# Patient Record
Sex: Female | Born: 1960 | Race: White | Hispanic: No | Marital: Married | Smoking: Never smoker
Health system: Southern US, Community
[De-identification: ages and names within clinical notes are randomized; demographics above are authoritative.]

## PROBLEM LIST (undated history)

## (undated) DIAGNOSIS — F32A Depression, unspecified: Secondary | ICD-10-CM

## (undated) DIAGNOSIS — L409 Psoriasis, unspecified: Secondary | ICD-10-CM

## (undated) DIAGNOSIS — F419 Anxiety disorder, unspecified: Secondary | ICD-10-CM

## (undated) DIAGNOSIS — I10 Essential (primary) hypertension: Secondary | ICD-10-CM

## (undated) DIAGNOSIS — G43909 Migraine, unspecified, not intractable, without status migrainosus: Secondary | ICD-10-CM

## (undated) DIAGNOSIS — F329 Major depressive disorder, single episode, unspecified: Secondary | ICD-10-CM

## (undated) DIAGNOSIS — B019 Varicella without complication: Secondary | ICD-10-CM

## (undated) HISTORY — DX: Major depressive disorder, single episode, unspecified: F32.9

## (undated) HISTORY — DX: Migraine, unspecified, not intractable, without status migrainosus: G43.909

## (undated) HISTORY — DX: Essential (primary) hypertension: I10

## (undated) HISTORY — DX: Anxiety disorder, unspecified: F41.9

## (undated) HISTORY — DX: Psoriasis, unspecified: L40.9

## (undated) HISTORY — DX: Depression, unspecified: F32.A

## (undated) HISTORY — DX: Varicella without complication: B01.9

---

## 2012-04-01 DIAGNOSIS — L404 Guttate psoriasis: Secondary | ICD-10-CM | POA: Insufficient documentation

## 2012-04-01 DIAGNOSIS — I10 Essential (primary) hypertension: Secondary | ICD-10-CM | POA: Insufficient documentation

## 2012-04-01 DIAGNOSIS — E876 Hypokalemia: Secondary | ICD-10-CM | POA: Insufficient documentation

## 2014-02-09 DIAGNOSIS — J309 Allergic rhinitis, unspecified: Secondary | ICD-10-CM | POA: Insufficient documentation

## 2014-02-09 DIAGNOSIS — E782 Mixed hyperlipidemia: Secondary | ICD-10-CM | POA: Insufficient documentation

## 2014-10-02 ENCOUNTER — Ambulatory Visit: Payer: Self-pay | Admitting: Physician Assistant

## 2014-10-02 LAB — RAPID STREP-A WITH REFLX: Micro Text Report: NEGATIVE

## 2014-10-05 LAB — BETA STREP CULTURE(ARMC)

## 2015-08-18 DIAGNOSIS — F319 Bipolar disorder, unspecified: Secondary | ICD-10-CM | POA: Insufficient documentation

## 2017-09-01 ENCOUNTER — Ambulatory Visit (INDEPENDENT_AMBULATORY_CARE_PROVIDER_SITE_OTHER): Payer: BC Managed Care – PPO | Admitting: Family

## 2017-09-01 ENCOUNTER — Other Ambulatory Visit: Payer: Self-pay

## 2017-09-01 ENCOUNTER — Encounter: Payer: Self-pay | Admitting: Family

## 2017-09-01 VITALS — BP 116/74 | HR 91 | Temp 98.6°F | Ht 62.0 in | Wt 150.0 lb

## 2017-09-01 DIAGNOSIS — F3130 Bipolar disorder, current episode depressed, mild or moderate severity, unspecified: Secondary | ICD-10-CM | POA: Diagnosis not present

## 2017-09-01 DIAGNOSIS — I10 Essential (primary) hypertension: Secondary | ICD-10-CM | POA: Diagnosis not present

## 2017-09-01 DIAGNOSIS — Z0000 Encounter for general adult medical examination without abnormal findings: Secondary | ICD-10-CM | POA: Diagnosis not present

## 2017-09-01 DIAGNOSIS — L404 Guttate psoriasis: Secondary | ICD-10-CM | POA: Diagnosis not present

## 2017-09-01 DIAGNOSIS — Z23 Encounter for immunization: Secondary | ICD-10-CM | POA: Diagnosis not present

## 2017-09-01 DIAGNOSIS — F319 Bipolar disorder, unspecified: Secondary | ICD-10-CM

## 2017-09-01 MED ORDER — LOSARTAN POTASSIUM-HCTZ 100-25 MG PO TABS: 1.0000 | ORAL_TABLET | Freq: Every day | ORAL | 1 refills | Status: DC

## 2017-09-01 NOTE — Patient Instructions (Addendum)
Pleasure meeting you!  Fasting labs when you can.   Please establish with dermatology for annual skin check and psoriasis- let me know if you end up needing a referral as discussed.   Health Maintenance, Female Adopting a healthy lifestyle and getting preventive care can go a long way to promote health and wellness. Talk with your health care provider about what schedule of regular examinations is right for you. This is a good chance for you to check in with your provider about disease prevention and staying healthy. In between checkups, there are plenty of things you can do on your own. Experts have done a lot of research about which lifestyle changes and preventive measures are most likely to keep you healthy. Ask your health care provider for more information. Weight and diet Eat a healthy diet  Be sure to include plenty of vegetables, fruits, low-fat dairy products, and lean protein.  Do not eat a lot of foods high in solid fats, added sugars, or salt.  Get regular exercise. This is one of the most important things you can do for your health. ? Most adults should exercise for at least 150 minutes each week. The exercise should increase your heart rate and make you sweat (moderate-intensity exercise). ? Most adults should also do strengthening exercises at least twice a week. This is in addition to the moderate-intensity exercise.  Maintain a healthy weight  Body mass index (BMI) is a measurement that can be used to identify possible weight problems. It estimates body fat based on height and weight. Your health care provider can help determine your BMI and help you achieve or maintain a healthy weight.  For females 29 years of age and older: ? A BMI below 18.5 is considered underweight. ? A BMI of 18.5 to 24.9 is normal. ? A BMI of 25 to 29.9 is considered overweight. ? A BMI of 30 and above is considered obese.  Watch levels of cholesterol and blood lipids  You should start having  your blood tested for lipids and cholesterol at 55 years of age, then have this test every 5 years.  You may need to have your cholesterol levels checked more often if: ? Your lipid or cholesterol levels are high. ? You are older than 56 years of age. ? You are at high risk for heart disease.  Cancer screening Lung Cancer  Lung cancer screening is recommended for adults 105-102 years old who are at high risk for lung cancer because of a history of smoking.  A yearly low-dose CT scan of the lungs is recommended for people who: ? Currently smoke. ? Have quit within the past 15 years. ? Have at least a 30-pack-year history of smoking. A pack year is smoking an average of one pack of cigarettes a day for 1 year.  Yearly screening should continue until it has been 15 years since you quit.  Yearly screening should stop if you develop a health problem that would prevent you from having lung cancer treatment.  Breast Cancer  Practice breast self-awareness. This means understanding how your breasts normally appear and feel.  It also means doing regular breast self-exams. Let your health care provider know about any changes, no matter how small.  If you are in your 20s or 30s, you should have a clinical breast exam (CBE) by a health care provider every 1-3 years as part of a regular health exam.  If you are 86 or older, have a CBE every year.  Also consider having a breast X-ray (mammogram) every year.  If you have a family history of breast cancer, talk to your health care provider about genetic screening.  If you are at high risk for breast cancer, talk to your health care provider about having an MRI and a mammogram every year.  Breast cancer gene (BRCA) assessment is recommended for women who have family members with BRCA-related cancers. BRCA-related cancers include: ? Breast. ? Ovarian. ? Tubal. ? Peritoneal cancers.  Results of the assessment will determine the need for genetic  counseling and BRCA1 and BRCA2 testing.  Cervical Cancer Your health care provider may recommend that you be screened regularly for cancer of the pelvic organs (ovaries, uterus, and vagina). This screening involves a pelvic examination, including checking for microscopic changes to the surface of your cervix (Pap test). You may be encouraged to have this screening done every 3 years, beginning at age 53.  For women ages 31-65, health care providers may recommend pelvic exams and Pap testing every 3 years, or they may recommend the Pap and pelvic exam, combined with testing for human papilloma virus (HPV), every 5 years. Some types of HPV increase your risk of cervical cancer. Testing for HPV may also be done on women of any age with unclear Pap test results.  Other health care providers may not recommend any screening for nonpregnant women who are considered low risk for pelvic cancer and who do not have symptoms. Ask your health care provider if a screening pelvic exam is right for you.  If you have had past treatment for cervical cancer or a condition that could lead to cancer, you need Pap tests and screening for cancer for at least 20 years after your treatment. If Pap tests have been discontinued, your risk factors (such as having a new sexual partner) need to be reassessed to determine if screening should resume. Some women have medical problems that increase the chance of getting cervical cancer. In these cases, your health care provider may recommend more frequent screening and Pap tests.  Colorectal Cancer  This type of cancer can be detected and often prevented.  Routine colorectal cancer screening usually begins at 56 years of age and continues through 56 years of age.  Your health care provider may recommend screening at an earlier age if you have risk factors for colon cancer.  Your health care provider may also recommend using home test kits to check for hidden blood in the  stool.  A small camera at the end of a tube can be used to examine your colon directly (sigmoidoscopy or colonoscopy). This is done to check for the earliest forms of colorectal cancer.  Routine screening usually begins at age 66.  Direct examination of the colon should be repeated every 5-10 years through 56 years of age. However, you may need to be screened more often if early forms of precancerous polyps or small growths are found.  Skin Cancer  Check your skin from head to toe regularly.  Tell your health care provider about any new moles or changes in moles, especially if there is a change in a mole's shape or color.  Also tell your health care provider if you have a mole that is larger than the size of a pencil eraser.  Always use sunscreen. Apply sunscreen liberally and repeatedly throughout the day.  Protect yourself by wearing long sleeves, pants, a wide-brimmed hat, and sunglasses whenever you are outside.  Heart disease, diabetes, and high  blood pressure  High blood pressure causes heart disease and increases the risk of stroke. High blood pressure is more likely to develop in: ? People who have blood pressure in the high end of the normal range (130-139/85-89 mm Hg). ? People who are overweight or obese. ? People who are African American.  If you are 3-100 years of age, have your blood pressure checked every 3-5 years. If you are 49 years of age or older, have your blood pressure checked every year. You should have your blood pressure measured twice-once when you are at a hospital or clinic, and once when you are not at a hospital or clinic. Record the average of the two measurements. To check your blood pressure when you are not at a hospital or clinic, you can use: ? An automated blood pressure machine at a pharmacy. ? A home blood pressure monitor.  If you are between 62 years and 2 years old, ask your health care provider if you should take aspirin to prevent  strokes.  Have regular diabetes screenings. This involves taking a blood sample to check your fasting blood sugar level. ? If you are at a normal weight and have a low risk for diabetes, have this test once every three years after 56 years of age. ? If you are overweight and have a high risk for diabetes, consider being tested at a younger age or more often. Preventing infection Hepatitis B  If you have a higher risk for hepatitis B, you should be screened for this virus. You are considered at high risk for hepatitis B if: ? You were born in a country where hepatitis B is common. Ask your health care provider which countries are considered high risk. ? Your parents were born in a high-risk country, and you have not been immunized against hepatitis B (hepatitis B vaccine). ? You have HIV or AIDS. ? You use needles to inject street drugs. ? You live with someone who has hepatitis B. ? You have had sex with someone who has hepatitis B. ? You get hemodialysis treatment. ? You take certain medicines for conditions, including cancer, organ transplantation, and autoimmune conditions.  Hepatitis C  Blood testing is recommended for: ? Everyone born from [birth year] through 1965. ? Anyone with known risk factors for hepatitis C.  Sexually transmitted infections (STIs)  You should be screened for sexually transmitted infections (STIs) including gonorrhea and chlamydia if: ? You are sexually active and are younger than 56 years of age. ? You are older than 56 years of age and your health care provider tells you that you are at risk for this type of infection. ? Your sexual activity has changed since you were last screened and you are at an increased risk for chlamydia or gonorrhea. Ask your health care provider if you are at risk.  If you do not have HIV, but are at risk, it may be recommended that you take a prescription medicine daily to prevent HIV infection. This is called pre-exposure prophylaxis  (PrEP). You are considered at risk if: ? You are sexually active and do not regularly use condoms or know the HIV status of your partner(s). ? You take drugs by injection. ? You are sexually active with a partner who has HIV.  Talk with your health care provider about whether you are at high risk of being infected with HIV. If you choose to begin PrEP, you should first be tested for HIV. You should then be tested  every 3 months for as long as you are taking PrEP. Pregnancy  If you are premenopausal and you may become pregnant, ask your health care provider about preconception counseling.  If you may become pregnant, take 400 to 800 micrograms (mcg) of folic acid every day.  If you want to prevent pregnancy, talk to your health care provider about birth control (contraception). Osteoporosis and menopause  Osteoporosis is a disease in which the bones lose minerals and strength with aging. This can result in serious bone fractures. Your risk for osteoporosis can be identified using a bone density scan.  If you are 66 years of age or older, or if you are at risk for osteoporosis and fractures, ask your health care provider if you should be screened.  Ask your health care provider whether you should take a calcium or vitamin D supplement to lower your risk for osteoporosis.  Menopause may have certain physical symptoms and risks.  Hormone replacement therapy may reduce some of these symptoms and risks. Talk to your health care provider about whether hormone replacement therapy is right for you. Follow these instructions at home:  Schedule regular health, dental, and eye exams.  Stay current with your immunizations.  Do not use any tobacco products including cigarettes, chewing tobacco, or electronic cigarettes.  If you are pregnant, do not drink alcohol.  If you are breastfeeding, limit how much and how often you drink alcohol.  Limit alcohol intake to no more than 1 drink per day for  nonpregnant women. One drink equals 12 ounces of beer, 5 ounces of wine, or 1 ounces of hard liquor.  Do not use street drugs.  Do not share needles.  Ask your health care provider for help if you need support or information about quitting drugs.  Tell your health care provider if you often feel depressed.  Tell your health care provider if you have ever been abused or do not feel safe at home. This information is not intended to replace advice given to you by your health care provider. Make sure you discuss any questions you have with your health care provider. Document Released: 06/03/2011 Document Revised: 04/25/2016 Document Reviewed: 08/22/2015 Elsevier Interactive Patient Education  [employer].

## 2017-09-01 NOTE — Progress Notes (Signed)
Subjective:    Patient ID: Brooke Harmon, female    DOB: [birth year], 56 y.o.   MRN: [id number]  CC: Brooke Harmon is a 56 y.o. female who presents today for physical exam.    HPI: [facility] since [facility].  Feeling well.   Bipolar depression- Follows with Magda Paganini DNP. Stable on regimen.   HTN- has been on stable years. Denies exertional chest pain or pressure, numbness or tingling radiating to left arm or jaw, palpitations, dizziness, frequent headaches, changes in vision, or shortness of breath.       Colorectal Cancer Screening: UTD , due 2021 Breast Cancer Screening: Mammogram UTD 01/2017; follows [facility] Cervical Cancer Screening: UTD, 01/2017 per patient. No polyps.  Bone Health screening DEXA for 65+: No increased fracture risk. Defer screening at this time. Lung Cancer Screening: Doesn't have 30 year pack year history and age > 22 years. Immunizations       Tetanus - UTD              Influenza- states she hasnt received flu shot 08/02/17 and that was a chart error. She would like flu vaccine today.  Hepatitis C screening - Candidate for, consents HIV Screening- Candidate for , declines Labs: Screening labs today. Exercise: Gets regular exercise.  Alcohol use: Couple times per week.  Smoking/tobacco use: Nonsmoker.  Regular dental exams: UTD Wears seat belt: Yes. Skin: h/o psorisis; no new lesions however reports worsening of late of psoriasis on face.   HISTORY:  Past Medical History:  Diagnosis Date  . Chicken pox   . Depression   . Hypertension   . Migraines   . Psoriasis     Past Surgical History:  Procedure Laterality Date  . CESAREAN SECTION     Family History  Problem Relation Age of Onset  . Hypertension Mother   . Mental illness Mother   . Cancer Father        prostate  . Hypertension Father   . Mental illness Father   . Diabetes Father       ALLERGIES: Lexapro  [escitalopram oxalate] and  Lisinopril  No current outpatient prescriptions on file prior to visit.   No current facility-administered medications on file prior to visit.     Social History  Substance Use Topics  . Smoking status: Never Smoker  . Smokeless tobacco: Never Used  . Alcohol use Yes    Review of Systems  Constitutional: Negative for chills, fever and unexpected weight change.  HENT: Negative for congestion.   Eyes: Negative for visual disturbance.  Respiratory: Negative for cough.   Cardiovascular: Negative for chest pain, palpitations and leg swelling.  Gastrointestinal: Negative for nausea and vomiting.  Musculoskeletal: Negative for arthralgias and myalgias.  Skin: Negative for rash.  Neurological: Negative for headaches.  Hematological: Negative for adenopathy.  Psychiatric/Behavioral: Negative for confusion.      Objective:    BP 116/74   Pulse 91   Temp 98.6 F (37 C) (Oral)   Ht 5\' 2"  (1.575 m)   Wt 150 lb (68 kg)   SpO2 96%   BMI 27.44 kg/m   BP Readings from Last 3 Encounters:  09/01/17 116/74   Wt Readings from Last 3 Encounters:  09/01/17 150 lb (68 kg)    Physical Exam  Constitutional: She appears well-developed and well-nourished.  Eyes: Conjunctivae are normal.  Neck: No thyroid mass and no thyromegaly present.  Cardiovascular: Normal rate, regular rhythm, normal heart  sounds and normal pulses.   Pulmonary/Chest: Effort normal and breath sounds normal. She has no wheezes. She has no rhonchi. She has no rales. Right breast exhibits no inverted nipple, no mass, no nipple discharge, no skin change and no tenderness. Left breast exhibits no inverted nipple, no mass, no nipple discharge, no skin change and no tenderness. Breasts are symmetrical.  CBE performed.   Lymphadenopathy:       Head (right side): No submental, no submandibular, no tonsillar, no preauricular, no posterior auricular and no occipital adenopathy present.       Head (left side): No submental, no  submandibular, no tonsillar, no preauricular, no posterior auricular and no occipital adenopathy present.    She has no cervical adenopathy.       Right cervical: No superficial cervical, no deep cervical and no posterior cervical adenopathy present.      Left cervical: No superficial cervical, no deep cervical and no posterior cervical adenopathy present.    She has no axillary adenopathy.  Neurological: She is alert.  Skin: Skin is warm and dry.  Psychiatric: She has a normal mood and affect. Her speech is normal and behavior is normal. Thought content normal.  Vitals reviewed.      Assessment & Plan:   Problem List Items Addressed This Visit      Cardiovascular and Mediastinum   Essential hypertension    Stable. Discussed need to do electrolytes, kidney function few times per year due to hyzaar. Patient understands this. Will continue regimen.       Relevant Medications   losartan-hydrochlorothiazide (HYZAAR) 100-25 MG tablet     Musculoskeletal and Integument   Psoriasis, guttate    Worsening. Patient will establish with dermatology once she checks her insurance; declines referral today and will let me know if she needs.         Other   Bipolar depression ([place])    Stable on regimen.       Routine physical examination - Primary    Up-to-date on colonoscopy, Pap smear, mammogram per patient and abstracted into chart. Up-to-date on immunizations.Screening labs ordered. Clinical breast exam performed today.no pelvic exam performed in the absence of complaints and Pap smear being normal per patient. Encouraged exercise. Patient will establish with dermatology.       Relevant Orders   CBC with Differential/Platelet   Comprehensive metabolic panel   Hemoglobin A1c   Hepatitis C antibody   Lipid panel   TSH   VITAMIN D 25 Hydroxy (Vit-D Deficiency, Fractures)    Other Visit Diagnoses    Need for immunization against influenza       Relevant Orders   Flu Vaccine QUAD 36+  mos IM (Completed)       I have discontinued Ms. Frieling's pimecrolimus and Sulfacetamide Sodium-Sulfur. I am also having her maintain her lamoTRIgine, LATUDA, tacrolimus, and losartan-hydrochlorothiazide.   Meds ordered this encounter  Medications  . losartan-hydrochlorothiazide (HYZAAR) 100-25 MG tablet    Sig: Take 1 tablet by mouth daily.    Dispense:  90 tablet    Refill:  1    Order Specific Question:   Supervising Provider    Answer:   Crecencio Mc [[id number]]    Return precautions given.   Risks, benefits, and alternatives of the medications and treatment plan prescribed today were discussed, and patient expressed understanding.   Education regarding symptom management and diagnosis given to patient on AVS.   Continue to follow with Vidal Schwalbe Yvetta Coder, FNP for  routine health maintenance.   Brooke Harmon and I agreed with plan.   Mable Paris, FNP

## 2017-09-01 NOTE — Assessment & Plan Note (Signed)
Worsening. Patient will establish with dermatology once she checks her insurance; declines referral today and will let me know if she needs.

## 2017-09-01 NOTE — Assessment & Plan Note (Addendum)
Up-to-date on colonoscopy, Pap smear, mammogram per patient and abstracted into chart. Up-to-date on immunizations.Screening labs ordered. Clinical breast exam performed today.no pelvic exam performed in the absence of complaints and Pap smear being normal per patient. Encouraged exercise. Patient will establish with dermatology.

## 2017-09-01 NOTE — Progress Notes (Signed)
Pre visit review using our clinic review tool, if applicable. No additional management support is needed unless otherwise documented below in the visit note. 

## 2017-09-01 NOTE — Progress Notes (Unsigned)
Pre visit review using our clinic review tool, if applicable. No additional management support is needed unless otherwise documented below in the visit note. 

## 2017-09-01 NOTE — Assessment & Plan Note (Signed)
Stable. Discussed need to do electrolytes, kidney function few times per year due to hyzaar. Patient understands this. Will continue regimen.

## 2017-09-01 NOTE — Assessment & Plan Note (Signed)
Stable on regimen 

## 2017-09-03 ENCOUNTER — Other Ambulatory Visit (INDEPENDENT_AMBULATORY_CARE_PROVIDER_SITE_OTHER): Payer: BC Managed Care – PPO

## 2017-09-03 DIAGNOSIS — Z0000 Encounter for general adult medical examination without abnormal findings: Secondary | ICD-10-CM | POA: Diagnosis not present

## 2017-09-03 LAB — CBC WITH DIFFERENTIAL/PLATELET
BASOS ABS: 0.2 10*3/uL — AB (ref 0.0–0.1)
Basophils Relative: 2.4 % (ref 0.0–3.0)
Eosinophils Absolute: 0.1 10*3/uL (ref 0.0–0.7)
Eosinophils Relative: 1.7 % (ref 0.0–5.0)
HCT: 41.7 % (ref 36.0–46.0)
Hemoglobin: 13.8 g/dL (ref 12.0–15.0)
LYMPHS ABS: 1.8 10*3/uL (ref 0.7–4.0)
Lymphocytes Relative: 28.9 % (ref 12.0–46.0)
MCHC: 33.1 g/dL (ref 30.0–36.0)
MCV: 91.2 fl (ref 78.0–100.0)
MONOS PCT: 11.8 % (ref 3.0–12.0)
Monocytes Absolute: 0.7 10*3/uL (ref 0.1–1.0)
NEUTROS ABS: 3.4 10*3/uL (ref 1.4–7.7)
NEUTROS PCT: 55.2 % (ref 43.0–77.0)
PLATELETS: 344 10*3/uL (ref 150.0–400.0)
RBC: 4.57 Mil/uL (ref 3.87–5.11)
RDW: 13.5 % (ref 11.5–15.5)
WBC: 6.2 10*3/uL (ref 4.0–10.5)

## 2017-09-03 LAB — COMPREHENSIVE METABOLIC PANEL
ALT: 16 U/L (ref 0–35)
AST: 13 U/L (ref 0–37)
Albumin: 4.1 g/dL (ref 3.5–5.2)
Alkaline Phosphatase: 79 U/L (ref 39–117)
BUN: 18 mg/dL (ref 6–23)
CO2: 29 meq/L (ref 19–32)
Calcium: 9.3 mg/dL (ref 8.4–10.5)
Chloride: 101 mEq/L (ref 96–112)
Creatinine, Ser: 0.91 mg/dL (ref 0.40–1.20)
GFR: 67.86 mL/min (ref >60.00)
GLUCOSE: 99 mg/dL (ref 70–99)
POTASSIUM: 3.7 meq/L (ref 3.5–5.1)
Sodium: 137 mEq/L (ref 135–145)
Total Bilirubin: 0.6 mg/dL (ref 0.2–1.2)
Total Protein: 7.3 g/dL (ref 6.0–8.3)

## 2017-09-03 LAB — LIPID PANEL
CHOL/HDL RATIO: 4
Cholesterol: 186 mg/dL (ref 0–200)
HDL: 47 mg/dL (ref >39.00)
LDL Cholesterol: 118 mg/dL — ABNORMAL HIGH (ref 0–99)
NonHDL: 139.01
TRIGLYCERIDES: 107 mg/dL (ref 0.0–149.0)
VLDL: 21.4 mg/dL (ref 0.0–40.0)

## 2017-09-03 LAB — TSH: TSH: 3.12 u[IU]/mL (ref 0.35–4.50)

## 2017-09-03 LAB — HEMOGLOBIN A1C: Hgb A1c MFr Bld: 5.6 % (ref 4.6–6.5)

## 2017-09-03 LAB — VITAMIN D 25 HYDROXY (VIT D DEFICIENCY, FRACTURES): VITD: 16.3 ng/mL — ABNORMAL LOW (ref 30.00–100.00)

## 2017-12-08 ENCOUNTER — Telehealth: Payer: Self-pay

## 2017-12-08 DIAGNOSIS — E559 Vitamin D deficiency, unspecified: Secondary | ICD-10-CM

## 2017-12-08 NOTE — Telephone Encounter (Signed)
Labs have been ordered but could not reach patient to schedule.

## 2017-12-08 NOTE — Telephone Encounter (Signed)
Copied from [place]. Topic: Inquiry >> Dec 08, 2017  1:01 PM Pricilla Handler wrote: Reason for CRM: Patient called wanting to schedule a lab to test her Vitamin D levels. Patient stated that Dr. Vidal Schwalbe wanted patient to have this test. Could not schedule, due to no order in Epic. Please call patient once order has been placed for this lab test.      Thank You!!!

## 2017-12-10 ENCOUNTER — Other Ambulatory Visit (INDEPENDENT_AMBULATORY_CARE_PROVIDER_SITE_OTHER): Payer: BC Managed Care – PPO

## 2017-12-10 DIAGNOSIS — E559 Vitamin D deficiency, unspecified: Secondary | ICD-10-CM

## 2017-12-11 LAB — VITAMIN D 25 HYDROXY (VIT D DEFICIENCY, FRACTURES): VITD: 22.8 ng/mL — ABNORMAL LOW (ref 30.00–100.00)

## 2017-12-14 ENCOUNTER — Other Ambulatory Visit: Payer: Self-pay | Admitting: Internal Medicine

## 2017-12-14 DIAGNOSIS — E559 Vitamin D deficiency, unspecified: Secondary | ICD-10-CM

## 2017-12-14 MED ORDER — CHOLECALCIFEROL 1.25 MG (50000 UT) PO TABS
1.0000 | ORAL_TABLET | ORAL | 1 refills | Status: DC
Start: 2017-12-14 — End: 2018-09-23

## 2018-08-08 ENCOUNTER — Other Ambulatory Visit: Payer: Self-pay | Admitting: Family

## 2018-08-08 DIAGNOSIS — I10 Essential (primary) hypertension: Secondary | ICD-10-CM

## 2018-09-23 ENCOUNTER — Ambulatory Visit (INDEPENDENT_AMBULATORY_CARE_PROVIDER_SITE_OTHER): Payer: BC Managed Care – PPO | Admitting: Family

## 2018-09-23 VITALS — BP 119/70 | HR 85 | Temp 98.4°F | Resp 15 | Ht 62.0 in | Wt 145.2 lb

## 2018-09-23 DIAGNOSIS — E559 Vitamin D deficiency, unspecified: Secondary | ICD-10-CM | POA: Insufficient documentation

## 2018-09-23 DIAGNOSIS — Z0000 Encounter for general adult medical examination without abnormal findings: Secondary | ICD-10-CM

## 2018-09-23 DIAGNOSIS — F319 Bipolar disorder, unspecified: Secondary | ICD-10-CM

## 2018-09-23 DIAGNOSIS — I10 Essential (primary) hypertension: Secondary | ICD-10-CM

## 2018-09-23 LAB — COMPREHENSIVE METABOLIC PANEL
ALBUMIN: 4.4 g/dL (ref 3.5–5.2)
ALK PHOS: 83 U/L (ref 39–117)
ALT: 15 U/L (ref 0–35)
AST: 15 U/L (ref 0–37)
BILIRUBIN TOTAL: 0.7 mg/dL (ref 0.2–1.2)
BUN: 19 mg/dL (ref 6–23)
CALCIUM: 9.8 mg/dL (ref 8.4–10.5)
CO2: 29 mEq/L (ref 19–32)
CREATININE: 0.92 mg/dL (ref 0.40–1.20)
Chloride: 103 mEq/L (ref 96–112)
GFR: 66.76 mL/min (ref >60.00)
Glucose, Bld: 128 mg/dL — ABNORMAL HIGH (ref 70–99)
Potassium: 3.4 mEq/L — ABNORMAL LOW (ref 3.5–5.1)
Sodium: 140 mEq/L (ref 135–145)
TOTAL PROTEIN: 7.6 g/dL (ref 6.0–8.3)

## 2018-09-23 LAB — VITAMIN D 25 HYDROXY (VIT D DEFICIENCY, FRACTURES): VITD: 37.24 ng/mL (ref 30.00–100.00)

## 2018-09-23 MED ORDER — LOSARTAN POTASSIUM-HCTZ 100-25 MG PO TABS: 1.0000 | ORAL_TABLET | Freq: Every day | ORAL | 4 refills | Status: DC

## 2018-09-23 NOTE — Assessment & Plan Note (Signed)
Stable. Will follow.  

## 2018-09-23 NOTE — Assessment & Plan Note (Signed)
Update mammogram.  Clinical breast exam performed today.  Patient follows with OB/GYN and reports normal Pap smear last year.  Deferred pelvic exam in the absence of complaints, normal Pap smear.Note: We discussed relevant and past history of abnormal labs today at length, we decided to order appropriate labs for screening today based on this discussion. Patient was comfortable with this. Patient was advised if any symptoms were to change after today's visit, to notify me as we may always order additional labs.

## 2018-09-23 NOTE — Patient Instructions (Signed)
Pleasure seeing you  Health Maintenance, Female Adopting a healthy lifestyle and getting preventive care can go a long way to promote health and wellness. Talk with your health care provider about what schedule of regular examinations is right for you. This is a good chance for you to check in with your provider about disease prevention and staying healthy. In between checkups, there are plenty of things you can do on your own. Experts have done a lot of research about which lifestyle changes and preventive measures are most likely to keep you healthy. Ask your health care provider for more information. Weight and diet Eat a healthy diet  Be sure to include plenty of vegetables, fruits, low-fat dairy products, and lean protein.  Do not eat a lot of foods high in solid fats, added sugars, or salt.  Get regular exercise. This is one of the most important things you can do for your health. ? Most adults should exercise for at least 150 minutes each week. The exercise should increase your heart rate and make you sweat (moderate-intensity exercise). ? Most adults should also do strengthening exercises at least twice a week. This is in addition to the moderate-intensity exercise.  Maintain a healthy weight  Body mass index (BMI) is a measurement that can be used to identify possible weight problems. It estimates body fat based on height and weight. Your health care provider can help determine your BMI and help you achieve or maintain a healthy weight.  For females 20 years of age and older: ? A BMI below 18.5 is considered underweight. ? A BMI of 18.5 to 24.9 is normal. ? A BMI of 25 to 29.9 is considered overweight. ? A BMI of 30 and above is considered obese.  Watch levels of cholesterol and blood lipids  You should start having your blood tested for lipids and cholesterol at 57 years of age, then have this test every 5 years.  You may need to have your cholesterol levels checked more often  if: ? Your lipid or cholesterol levels are high. ? You are older than 57 years of age. ? You are at high risk for heart disease.  Cancer screening Lung Cancer  Lung cancer screening is recommended for adults 55-80 years old who are at high risk for lung cancer because of a history of smoking.  A yearly low-dose CT scan of the lungs is recommended for people who: ? Currently smoke. ? Have quit within the past 15 years. ? Have at least a 30-pack-year history of smoking. A pack year is smoking an average of one pack of cigarettes a day for 1 year.  Yearly screening should continue until it has been 15 years since you quit.  Yearly screening should stop if you develop a health problem that would prevent you from having lung cancer treatment.  Breast Cancer  Practice breast self-awareness. This means understanding how your breasts normally appear and feel.  It also means doing regular breast self-exams. Let your health care provider know about any changes, no matter how small.  If you are in your 20s or 30s, you should have a clinical breast exam (CBE) by a health care provider every 1-3 years as part of a regular health exam.  If you are 40 or older, have a CBE every year. Also consider having a breast X-ray (mammogram) every year.  If you have a family history of breast cancer, talk to your health care provider about genetic screening.  If you   are at high risk for breast cancer, talk to your health care provider about having an MRI and a mammogram every year.  Breast cancer gene (BRCA) assessment is recommended for women who have family members with BRCA-related cancers. BRCA-related cancers include: ? Breast. ? Ovarian. ? Tubal. ? Peritoneal cancers.  Results of the assessment will determine the need for genetic counseling and BRCA1 and BRCA2 testing.  Cervical Cancer Your health care provider may recommend that you be screened regularly for cancer of the pelvic organs  (ovaries, uterus, and vagina). This screening involves a pelvic examination, including checking for microscopic changes to the surface of your cervix (Pap test). You may be encouraged to have this screening done every 3 years, beginning at age 72.  For women ages 67-65, health care providers may recommend pelvic exams and Pap testing every 3 years, or they may recommend the Pap and pelvic exam, combined with testing for human papilloma virus (HPV), every 5 years. Some types of HPV increase your risk of cervical cancer. Testing for HPV may also be done on women of any age with unclear Pap test results.  Other health care providers may not recommend any screening for nonpregnant women who are considered low risk for pelvic cancer and who do not have symptoms. Ask your health care provider if a screening pelvic exam is right for you.  If you have had past treatment for cervical cancer or a condition that could lead to cancer, you need Pap tests and screening for cancer for at least 20 years after your treatment. If Pap tests have been discontinued, your risk factors (such as having a new sexual partner) need to be reassessed to determine if screening should resume. Some women have medical problems that increase the chance of getting cervical cancer. In these cases, your health care provider may recommend more frequent screening and Pap tests.  Colorectal Cancer  This type of cancer can be detected and often prevented.  Routine colorectal cancer screening usually begins at 57 years of age and continues through 57 years of age.  Your health care provider may recommend screening at an earlier age if you have risk factors for colon cancer.  Your health care provider may also recommend using home test kits to check for hidden blood in the stool.  A small camera at the end of a tube can be used to examine your colon directly (sigmoidoscopy or colonoscopy). This is done to check for the earliest forms of  colorectal cancer.  Routine screening usually begins at age 74.  Direct examination of the colon should be repeated every 5-10 years through 57 years of age. However, you may need to be screened more often if early forms of precancerous polyps or small growths are found.  Skin Cancer  Check your skin from head to toe regularly.  Tell your health care provider about any new moles or changes in moles, especially if there is a change in a mole's shape or color.  Also tell your health care provider if you have a mole that is larger than the size of a pencil eraser.  Always use sunscreen. Apply sunscreen liberally and repeatedly throughout the day.  Protect yourself by wearing long sleeves, pants, a wide-brimmed hat, and sunglasses whenever you are outside.  Heart disease, diabetes, and high blood pressure  High blood pressure causes heart disease and increases the risk of stroke. High blood pressure is more likely to develop in: ? People who have blood pressure in  the high end of the normal range (130-139/85-89 mm Hg). ? People who are overweight or obese. ? People who are African American.  If you are 18-39 years of age, have your blood pressure checked every 3-5 years. If you are 40 years of age or older, have your blood pressure checked every year. You should have your blood pressure measured twice-once when you are at a hospital or clinic, and once when you are not at a hospital or clinic. Record the average of the two measurements. To check your blood pressure when you are not at a hospital or clinic, you can use: ? An automated blood pressure machine at a pharmacy. ? A home blood pressure monitor.  If you are between 55 years and 79 years old, ask your health care provider if you should take aspirin to prevent strokes.  Have regular diabetes screenings. This involves taking a blood sample to check your fasting blood sugar level. ? If you are at a normal weight and have a low risk  for diabetes, have this test once every three years after 57 years of age. ? If you are overweight and have a high risk for diabetes, consider being tested at a younger age or more often. Preventing infection Hepatitis B  If you have a higher risk for hepatitis B, you should be screened for this virus. You are considered at high risk for hepatitis B if: ? You were born in a country where hepatitis B is common. Ask your health care provider which countries are considered high risk. ? Your parents were born in a high-risk country, and you have not been immunized against hepatitis B (hepatitis B vaccine). ? You have HIV or AIDS. ? You use needles to inject street drugs. ? You live with someone who has hepatitis B. ? You have had sex with someone who has hepatitis B. ? You get hemodialysis treatment. ? You take certain medicines for conditions, including cancer, organ transplantation, and autoimmune conditions.  Hepatitis C  Blood testing is recommended for: ? Everyone born from 1945 through 1965. ? Anyone with known risk factors for hepatitis C.  Sexually transmitted infections (STIs)  You should be screened for sexually transmitted infections (STIs) including gonorrhea and chlamydia if: ? You are sexually active and are younger than 57 years of age. ? You are older than 57 years of age and your health care provider tells you that you are at risk for this type of infection. ? Your sexual activity has changed since you were last screened and you are at an increased risk for chlamydia or gonorrhea. Ask your health care provider if you are at risk.  If you do not have HIV, but are at risk, it may be recommended that you take a prescription medicine daily to prevent HIV infection. This is called pre-exposure prophylaxis (PrEP). You are considered at risk if: ? You are sexually active and do not regularly use condoms or know the HIV status of your partner(s). ? You take drugs by  injection. ? You are sexually active with a partner who has HIV.  Talk with your health care provider about whether you are at high risk of being infected with HIV. If you choose to begin PrEP, you should first be tested for HIV. You should then be tested every 3 months for as long as you are taking PrEP. Pregnancy  If you are premenopausal and you may become pregnant, ask your health care provider about preconception counseling.    If you may become pregnant, take 400 to 800 micrograms (mcg) of folic acid every day.  If you want to prevent pregnancy, talk to your health care provider about birth control (contraception). Osteoporosis and menopause  Osteoporosis is a disease in which the bones lose minerals and strength with aging. This can result in serious bone fractures. Your risk for osteoporosis can be identified using a bone density scan.  If you are 17 years of age or older, or if you are at risk for osteoporosis and fractures, ask your health care provider if you should be screened.  Ask your health care provider whether you should take a calcium or vitamin D supplement to lower your risk for osteoporosis.  Menopause may have certain physical symptoms and risks.  Hormone replacement therapy may reduce some of these symptoms and risks. Talk to your health care provider about whether hormone replacement therapy is right for you. Follow these instructions at home:  Schedule regular health, dental, and eye exams.  Stay current with your immunizations.  Do not use any tobacco products including cigarettes, chewing tobacco, or electronic cigarettes.  If you are pregnant, do not drink alcohol.  If you are breastfeeding, limit how much and how often you drink alcohol.  Limit alcohol intake to no more than 1 drink per day for nonpregnant women. One drink equals 12 ounces of beer, 5 ounces of wine, or 1 ounces of hard liquor.  Do not use street drugs.  Do not share needles.  Ask  your health care provider for help if you need support or information about quitting drugs.  Tell your health care provider if you often feel depressed.  Tell your health care provider if you have ever been abused or do not feel safe at home. This information is not intended to replace advice given to you by your health care provider. Make sure you discuss any questions you have with your health care provider. Document Released: 06/03/2011 Document Revised: 04/25/2016 Document Reviewed: 08/22/2015 Elsevier Interactive Patient Education  [employer].

## 2018-09-23 NOTE — Assessment & Plan Note (Signed)
At goal. Continue current regimen. 

## 2018-09-23 NOTE — Assessment & Plan Note (Signed)
Pending lab 

## 2018-09-23 NOTE — Progress Notes (Signed)
Subjective:    Patient ID: Brooke Harmon, female    DOB: [birth year], 57 y.o.   MRN: [id number]  CC: Brooke Harmon is a 57 y.o. female who presents today for physical exam.    HPI: Doing well. No complaints today. No fatigue  HTN- Doesn't check at home. Denies exertional chest pain or pressure, numbness or tingling radiating to left arm or jaw, palpitations, dizziness, frequent headaches, changes in vision, or shortness of breath.    Bipolar -feels well. Symptoms controlled. No si/hi.  follows with Brooke Harmon; sees every 6 months.   H/o vitamin D deficiency    Colorectal Cancer Screening: UTD , due 2022 Breast Cancer Screening: Mammogram UTD Cervical Cancer Screening: follows with GYN/[facility]. She reports normal in 2018.  Bone Health screening/DEXA for 65+: No increased fracture risk. Defer screening at this time. Lung Cancer Screening: Doesn't have 30 year pack year history and age > 52 years. Immunizations       Tetanus - utd         Hepatitis C screening - Candidate for, consents  Labs: Screening labs today. Exercise: Gets regular exercise.  Alcohol use: Occasional Smoking/tobacco use: Nonsmoker.  Regular dental exams: UTD Wears seat belt: Yes. Skin: following with dermatology for annual skin check and psoriasis. No h/o skin cancer.   HISTORY:  Past Medical History:  Diagnosis Date  . Chicken pox   . Depression   . Hypertension   . Migraines   . Psoriasis     Past Surgical History:  Procedure Laterality Date  . CESAREAN SECTION     Family History  Problem Relation Age of Onset  . Hypertension Mother   . Mental illness Mother   . Cancer Father        prostate  . Hypertension Father   . Mental illness Father   . Diabetes Father       ALLERGIES: Lexapro  [escitalopram oxalate] and Lisinopril  Current Outpatient Medications on File Prior to Visit  Medication Sig Dispense Refill  . lamoTRIgine (LAMICTAL) 100 MG tablet Take  100 mg by mouth at bedtime.  1  . LATUDA 20 MG TABS tablet TAKE 1 TABLET BY MOUTH WITH A MEAL  1  . tacrolimus (PROTOPIC) 0.1 % ointment tacrolimus 0.1 % topical ointment     No current facility-administered medications on file prior to visit.     Social History   Tobacco Use  . Smoking status: Never Smoker  . Smokeless tobacco: Never Used  Substance Use Topics  . Alcohol use: Yes  . Drug use: No    Review of Systems  Constitutional: Negative for chills, fatigue, fever and unexpected weight change.  HENT: Negative for congestion.   Respiratory: Negative for cough.   Cardiovascular: Negative for chest pain, palpitations and leg swelling.  Gastrointestinal: Negative for nausea and vomiting.  Genitourinary: Negative for vaginal pain.  Musculoskeletal: Negative for arthralgias and myalgias.  Skin: Negative for rash.  Neurological: Negative for headaches.  Hematological: Negative for adenopathy.  Psychiatric/Behavioral: Negative for confusion.      Objective:    BP 119/70   Pulse 85   Temp 98.4 F (36.9 C) (Oral)   Resp 15   Ht 5\' 2"  (1.575 m)   Wt 145 lb 4 oz (65.9 kg)   SpO2 98%   BMI 26.57 kg/m   BP Readings from Last 3 Encounters:  09/23/18 119/70  09/01/17 116/74   Wt Readings from Last 3 Encounters:  09/23/18  145 lb 4 oz (65.9 kg)  09/01/17 150 lb (68 kg)    Physical Exam  Constitutional: She appears well-developed and well-nourished.  Eyes: Conjunctivae are normal.  Neck: No thyroid mass and no thyromegaly present.  Cardiovascular: Normal rate, regular rhythm, normal heart sounds and normal pulses.  Pulmonary/Chest: Effort normal and breath sounds normal. She has no wheezes. She has no rhonchi. She has no rales. Right breast exhibits no inverted nipple, no mass, no nipple discharge, no skin change and no tenderness. Left breast exhibits no inverted nipple, no mass, no nipple discharge, no skin change and no tenderness. Breasts are symmetrical.  CBE  performed.   Lymphadenopathy:       Head (right side): No submental, no submandibular, no tonsillar, no preauricular, no posterior auricular and no occipital adenopathy present.       Head (left side): No submental, no submandibular, no tonsillar, no preauricular, no posterior auricular and no occipital adenopathy present.    She has no cervical adenopathy.       Right cervical: No superficial cervical, no deep cervical and no posterior cervical adenopathy present.      Left cervical: No superficial cervical, no deep cervical and no posterior cervical adenopathy present.    She has no axillary adenopathy.  Neurological: She is alert.  Skin: Skin is warm and dry.  Psychiatric: She has a normal mood and affect. Her speech is normal and behavior is normal. Thought content normal.  Vitals reviewed.      Assessment & Plan:   Problem List Items Addressed This Visit      Cardiovascular and Mediastinum   Essential hypertension    At goal.  Continue current regimen.      Relevant Medications   losartan-hydrochlorothiazide (HYZAAR) 100-25 MG tablet     Other   Bipolar depression (HCC)    Stable. Will follow      Routine physical examination - Primary    Update mammogram.  Clinical breast exam performed today.  Patient follows with OB/GYN and reports normal Pap smear last year.  Deferred pelvic exam in the absence of complaints, normal Pap smear.Note: We discussed relevant and past history of abnormal labs today at length, we decided to order appropriate labs for screening today based on this discussion. Patient was comfortable with this. Patient was advised if any symptoms were to change after today's visit, to notify me as we may always order additional labs.       Relevant Orders   Comprehensive metabolic panel   Hepatitis C antibody   VITAMIN D 25 Hydroxy (Vit-D Deficiency, Fractures)   Vitamin D deficiency    Pending lab.      Relevant Orders   VITAMIN D 25 Hydroxy (Vit-D  Deficiency, Fractures)       I have discontinued Shirley Friar. Bober's Cholecalciferol. I have also changed her losartan-hydrochlorothiazide. Additionally, I am having her maintain her lamoTRIgine, LATUDA, and tacrolimus.   Meds ordered this encounter  Medications  . losartan-hydrochlorothiazide (HYZAAR) 100-25 MG tablet    Sig: Take 1 tablet by mouth daily.    Dispense:  90 tablet    Refill:  4    Order Specific Question:   Supervising Provider    Answer:   Crecencio Mc [[id number]]    Return precautions given.   Risks, benefits, and alternatives of the medications and treatment plan prescribed today were discussed, and patient expressed understanding.   Education regarding symptom management and diagnosis given to patient on AVS.  Continue to follow with Burnard Hawthorne, FNP for routine health maintenance.   Brooke Harmon and I agreed with plan.   Mable Paris, FNP

## 2018-09-24 LAB — HEPATITIS C ANTIBODY
Hepatitis C Ab: NONREACTIVE
SIGNAL TO CUT-OFF: 0.02 (ref <1.00)

## 2018-09-28 ENCOUNTER — Other Ambulatory Visit: Payer: Self-pay | Admitting: Family

## 2018-09-28 DIAGNOSIS — I10 Essential (primary) hypertension: Secondary | ICD-10-CM

## 2018-10-12 ENCOUNTER — Other Ambulatory Visit (INDEPENDENT_AMBULATORY_CARE_PROVIDER_SITE_OTHER): Payer: BC Managed Care – PPO

## 2018-10-12 DIAGNOSIS — I10 Essential (primary) hypertension: Secondary | ICD-10-CM

## 2018-10-13 LAB — BASIC METABOLIC PANEL
BUN: 17 mg/dL (ref 6–23)
CALCIUM: 9.3 mg/dL (ref 8.4–10.5)
CO2: 30 mEq/L (ref 19–32)
CREATININE: 0.82 mg/dL (ref 0.40–1.20)
Chloride: 101 mEq/L (ref 96–112)
GFR: 76.23 mL/min (ref >60.00)
Glucose, Bld: 104 mg/dL — ABNORMAL HIGH (ref 70–99)
Potassium: 3.6 mEq/L (ref 3.5–5.1)
SODIUM: 139 meq/L (ref 135–145)

## 2018-10-26 ENCOUNTER — Encounter: Payer: Self-pay | Admitting: Internal Medicine

## 2018-10-26 ENCOUNTER — Ambulatory Visit: Payer: BC Managed Care – PPO | Admitting: Internal Medicine

## 2018-10-26 ENCOUNTER — Ambulatory Visit (INDEPENDENT_AMBULATORY_CARE_PROVIDER_SITE_OTHER): Payer: BC Managed Care – PPO

## 2018-10-26 VITALS — BP 136/74 | HR 83 | Temp 98.1°F | Resp 15 | Ht 62.0 in | Wt 145.8 lb

## 2018-10-26 DIAGNOSIS — M5386 Other specified dorsopathies, lumbar region: Secondary | ICD-10-CM

## 2018-10-26 DIAGNOSIS — M5432 Sciatica, left side: Secondary | ICD-10-CM

## 2018-10-26 MED ORDER — TRAMADOL HCL 50 MG PO TABS: 50.0000 mg | ORAL_TABLET | Freq: Three times a day (TID) | ORAL | 0 refills | Status: DC | PRN

## 2018-10-26 MED ORDER — PREDNISONE 10 MG PO TABS: ORAL_TABLET | ORAL | 0 refills | Status: DC

## 2018-10-26 NOTE — Progress Notes (Signed)
Subjective:  Patient ID: Brooke Harmon, female    DOB: 06-06-1961  Age: 57 y.o. MRN: [id number]  CC: The primary encounter diagnosis was Sciatica of left side associated with disorder of lumbar spine. A diagnosis of Sciatica of left side was also pertinent to this visit.  HPI Brooke Harmon presents for evaluation of low back pain,. 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  She states that there is no comfortable position but it does feel slightly better if she keeps still . The pain radiates to the left lateral thigh, and frequently radiates to her  Mid calf.    History of similar episode in 90's . Resolved spontaneously.  No prior imaging .     Outpatient Medications Prior to Visit  Medication Sig Dispense Refill  . lamoTRIgine (LAMICTAL) 100 MG tablet Take 100 mg by mouth at bedtime.  1  . LATUDA 20 MG TABS tablet TAKE 1 TABLET BY MOUTH WITH A MEAL  1  . losartan-hydrochlorothiazide (HYZAAR) 100-25 MG tablet Take 1 tablet by mouth daily. 90 tablet 4  . tacrolimus (PROTOPIC) 0.1 % ointment tacrolimus 0.1 % topical ointment     No facility-administered medications prior to visit.     Review of Systems;  Patient denies headache, fevers, malaise, unintentional weight loss, skin rash, eye pain, sinus congestion and sinus pain, sore throat, dysphagia,  hemoptysis , cough, dyspnea, wheezing, chest pain, palpitations, orthopnea, edema, abdominal pain, nausea, melena, diarrhea, constipation, flank pain, dysuria, hematuria, urinary  Frequency, nocturia, numbness, tingling, seizures,  Focal weakness, Loss of consciousness,  Tremor, insomnia, depression, anxiety, and suicidal ideation.      Objective:  BP 136/74 (BP Location: Left Arm, Patient Position: Sitting, Cuff Size: Normal)   Pulse 83   Temp 98.1 F (36.7 C) (Oral)   Resp  15   Ht 5\' 2"  (1.575 m)   Wt 145 lb 12.8 oz (66.1 kg)   SpO2 96%   BMI 26.67 kg/m   BP Readings from Last 3 Encounters:  10/26/18 136/74  09/23/18 119/70  09/01/17 116/74    Wt Readings from Last 3 Encounters:  10/26/18 145 lb 12.8 oz (66.1 kg)  09/23/18 145 lb 4 oz (65.9 kg)  09/01/17 150 lb (68 kg)    General appearance: alert, cooperative and appears stated age Neck: no adenopathy, no carotid bruit, supple, symmetrical, trachea midline and thyroid not enlarged, symmetric, no tenderness/mass/nodules Back: symmetric, slight rightward  curvature. ROM normal. No CVA tenderness. paraspinus muscle tenderness on the left.  Straight leg lift is positive on the left but not on the right  Lungs: clear to auscultation bilaterally Heart: regular rate and rhythm, S1, S2 normal, no murmur, click, rub or gallop Abdomen: soft, non-tender; bowel sounds normal; no masses,  no organomegaly Pulses: 2+ and symmetric Skin: Skin color, texture, turgor normal. No rashes or lesions Lymph nodes: Cervical, supraclavicular, and axillary nodes normal. Neuro: CNs 2-12 intact. DTRs 2+/4 in biceps, brachioradialis, 4/4 in  patellars and 2/4 in achilles. Muscle strength 5/5 in upper and lower exremities.    Gait normal.   Lab Results  Component Value Date   HGBA1C 5.6 09/03/2017    Lab Results  Component Value Date   CREATININE 0.82 10/12/2018   CREATININE 0.92 09/23/2018   CREATININE 0.91 09/03/2017  Lab Results  Component Value Date   WBC 6.2 09/03/2017   HGB 13.8 09/03/2017   HCT 41.7 09/03/2017   PLT 344.0 09/03/2017   GLUCOSE 104 (H) 10/12/2018   CHOL 186 09/03/2017   TRIG 107.0 09/03/2017   HDL 47.00 09/03/2017   LDLCALC 118 (H) 09/03/2017   ALT 15 09/23/2018   AST 15 09/23/2018   NA 139 10/12/2018   K 3.6 10/12/2018   CL 101 10/12/2018   CREATININE 0.82 10/12/2018   BUN 17 10/12/2018   CO2 30 10/12/2018   TSH 3.12 09/03/2017   HGBA1C 5.6 09/03/2017    No results  found.  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  Recommending prednisone taper x 6 days ,  Tylenol  Up to 2000 mg daily and add tramadol if needed.  If no improvement  In 2-3 weeks  Refer for PT and MRI       Other Visit Diagnoses    Sciatica of left side associated with disorder of lumbar spine    -  Primary   Relevant Orders   DG Lumbar Spine Complete (Completed)    A total of 25 minutes of face to face time was spent with patient more than half of which was spent in counselling about the above mentioned conditions  and coordination of care   I am having Shirley Friar. [place] start on predniSONE and traMADol. I am also having her maintain her lamoTRIgine, LATUDA, tacrolimus, and losartan-hydrochlorothiazide.  Meds ordered this encounter  Medications  . predniSONE (DELTASONE) 10 MG tablet    Sig: 6 tablets on Day 1 , then reduce by 1 tablet daily until gone    Dispense:  21 tablet    Refill:  0  . traMADol (ULTRAM) 50 MG tablet    Sig: Take 1 tablet (50 mg total) by mouth every 8 (eight) hours as needed.    Dispense:  30 tablet    Refill:  0    There are no discontinued medications.  Follow-up: No follow-ups on file.   Crecencio Mc, MD

## 2018-10-26 NOTE — Patient Instructions (Signed)
Prednisone 10 mg tablets.  Take 6 tablets all at once on Day 1,  Then taper by 1 tablet daily until gone.  Once you have finished the prednisone,  You can use Aleve twice daily or Motrin 3 times daily   You can add up to 2000 mg of acetominophen (tylenol) every day safely  In divided doses (500 mg every 6 hours  Or 1000 mg every 12 hours.)   Add tramadol 50 mg every 6 hours to the above regimen if needed for severe pain  Plain x rays today.  If a herniated disk is suspected and pain does not signficantly improve in 2 weeks.    We will get an MRI     Sciatica Sciatica is pain, numbness, weakness, or tingling along the path of the sciatic nerve. The sciatic nerve starts in the lower back and runs down the back of each leg. The nerve controls the muscles in the lower leg and in the back of the knee. It also provides feeling (sensation) to the back of the thigh, the lower leg, and the sole of the foot. Sciatica is a symptom of another medical condition that pinches or puts pressure on the sciatic nerve. Generally, sciatica only affects one side of the body. Sciatica usually goes away on its own or with treatment. In some cases, sciatica may keep coming back (recur). What are the causes? This condition is caused by pressure on the sciatic nerve, or pinching of the sciatic nerve. This may be the result of:  A disk in between the bones of the spine (vertebrae) bulging out too far (herniated disk).  Age-related changes in the spinal disks (degenerative disk disease).  A pain disorder that affects a muscle in the buttock (piriformis syndrome).  Extra bone growth (bone spur) near the sciatic nerve.  An injury or break (fracture) of the pelvis.  Pregnancy.  Tumor (rare).  What increases the risk? The following factors may make you more likely to develop this condition:  Playing sports that place pressure or stress on the spine, such as football or weight lifting.  Having poor strength and  flexibility.  A history of back injury.  A history of back surgery.  Sitting for long periods of time.  Doing activities that involve repetitive bending or lifting.  Obesity.  What are the signs or symptoms? Symptoms can vary from mild to very severe, and they may include:  Any of these problems in the lower back, leg, hip, or buttock: ? Mild tingling or dull aches. ? Burning sensations. ? Sharp pains.  Numbness in the back of the calf or the sole of the foot.  Leg weakness.  Severe back pain that makes movement difficult.  These symptoms may get worse when you cough, sneeze, or laugh, or when you sit or stand for long periods of time. Being overweight may also make symptoms worse. In some cases, symptoms may recur over time. How is this diagnosed? This condition may be diagnosed based on:  Your symptoms.  A physical exam. Your health care provider may ask you to do certain movements to check whether those movements trigger your symptoms.  You may have tests, including: ? Blood tests. ? X-rays. ? MRI. ? CT scan.  How is this treated? In many cases, this condition improves on its own, without any treatment. However, treatment may include:  Reducing or modifying physical activity during periods of pain.  Exercising and stretching to strengthen your abdomen and improve the flexibility  of your spine.  Icing and applying heat to the affected area.  Medicines that help: ? To relieve pain and swelling. ? To relax your muscles.  Injections of medicines that help to relieve pain, irritation, and inflammation around the sciatic nerve (steroids).  Surgery.  Follow these instructions at home: Medicines  Take over-the-counter and prescription medicines only as told by your health care provider.  Do not drive or operate heavy machinery while taking prescription pain medicine. Managing pain  If directed, apply ice to the affected area. ? Put ice in a plastic  bag. ? Place a towel between your skin and the bag. ? Leave the ice on for 20 minutes, 2-3 times a day.  After icing, apply heat to the affected area before you exercise or as often as told by your health care provider. Use the heat source that your health care provider recommends, such as a moist heat pack or a heating pad. ? Place a towel between your skin and the heat source. ? Leave the heat on for 20-30 minutes. ? Remove the heat if your skin turns bright red. This is especially important if you are unable to feel pain, heat, or cold. You may have a greater risk of getting burned. Activity  Return to your normal activities as told by your health care provider. Ask your health care provider what activities are safe for you. ? Avoid activities that make your symptoms worse.  Take brief periods of rest throughout the day. Resting in a lying or standing position is usually better than sitting to rest. ? When you rest for longer periods, mix in some mild activity or stretching between periods of rest. This will help to prevent stiffness and pain. ? Avoid sitting for long periods of time without moving. Get up and move around at least one time each hour.  Exercise and stretch regularly, as told by your health care provider.  Do not lift anything that is heavier than 10 lb (4.5 kg) while you have symptoms of sciatica. When you do not have symptoms, you should still avoid heavy lifting, especially repetitive heavy lifting.  When you lift objects, always use proper lifting technique, which includes: ? Bending your knees. ? Keeping the load close to your body. ? Avoiding twisting. General instructions  Use good posture. ? Avoid leaning forward while sitting. ? Avoid hunching over while standing.  Maintain a healthy weight. Excess weight puts extra stress on your back and makes it difficult to maintain good posture.  Wear supportive, comfortable shoes. Avoid wearing high heels.  Avoid  sleeping on a mattress that is too soft or too hard. A mattress that is firm enough to support your back when you sleep may help to reduce your pain.  Keep all follow-up visits as told by your health care provider. This is important. Contact a health care provider if:  You have pain that wakes you up when you are sleeping.  You have pain that gets worse when you lie down.  Your pain is worse than you have experienced in the past.  Your pain lasts longer than 4 weeks.  You experience unexplained weight loss. Get help right away if:  You lose control of your bowel or bladder (incontinence).  You have: ? Weakness in your lower back, pelvis, buttocks, or legs that gets worse. ? Redness or swelling of your back. ? A burning sensation when you urinate. This information is not intended to replace advice given to you by  your health care provider. Make sure you discuss any questions you have with your health care provider. Document Released: 11/12/2001 Document Revised: 04/23/2016 Document Reviewed: 07/28/2015 Elsevier Interactive Patient Education  [employer].

## 2018-10-27 DIAGNOSIS — M5432 Sciatica, left side: Secondary | ICD-10-CM | POA: Insufficient documentation

## 2018-10-27 NOTE — Assessment & Plan Note (Addendum)
Plain films done of spine are negative for fractures but show mild scoliosis at L2 , disk space narrowing and degenerative changes at L4-5s . Will treat with prednisone taper, , tylenol and tramadol. Transition toSAID after prednisone taper complete.  If sciatica is still present in a few weeks,  Consider MRI

## 2019-05-13 ENCOUNTER — Other Ambulatory Visit: Payer: Self-pay | Admitting: Obstetrics and Gynecology

## 2019-05-13 DIAGNOSIS — R928 Other abnormal and inconclusive findings on diagnostic imaging of breast: Secondary | ICD-10-CM

## 2019-05-15 LAB — HM PAP SMEAR
HM Pap smear: NEGATIVE
HPV 16/18/45 genotyping: NEGATIVE

## 2019-05-19 ENCOUNTER — Ambulatory Visit
Admission: RE | Admit: 2019-05-19 | Discharge: 2019-05-19 | Disposition: A | Discharge status: Home / Self Care | Payer: BC Managed Care – PPO | Source: Ambulatory Visit | Attending: Obstetrics and Gynecology | Admitting: Obstetrics and Gynecology

## 2019-05-19 ENCOUNTER — Other Ambulatory Visit: Payer: Self-pay

## 2019-05-19 DIAGNOSIS — R928 Other abnormal and inconclusive findings on diagnostic imaging of breast: Secondary | ICD-10-CM

## 2019-09-23 ENCOUNTER — Other Ambulatory Visit: Payer: Self-pay

## 2019-09-27 ENCOUNTER — Other Ambulatory Visit: Payer: Self-pay

## 2019-09-27 ENCOUNTER — Ambulatory Visit (INDEPENDENT_AMBULATORY_CARE_PROVIDER_SITE_OTHER): Payer: BC Managed Care – PPO | Admitting: Family Medicine

## 2019-09-27 ENCOUNTER — Encounter: Payer: Self-pay | Admitting: Family Medicine

## 2019-09-27 ENCOUNTER — Ambulatory Visit: Payer: BC Managed Care – PPO | Admitting: Family

## 2019-09-27 VITALS — BP 120/82 | HR 65 | Temp 98.4°F | Ht 62.75 in | Wt 148.0 lb

## 2019-09-27 DIAGNOSIS — Z8619 Personal history of other infectious and parasitic diseases: Secondary | ICD-10-CM | POA: Diagnosis not present

## 2019-09-27 DIAGNOSIS — Z0000 Encounter for general adult medical examination without abnormal findings: Secondary | ICD-10-CM

## 2019-09-27 DIAGNOSIS — R7989 Other specified abnormal findings of blood chemistry: Secondary | ICD-10-CM

## 2019-09-27 LAB — COMPREHENSIVE METABOLIC PANEL
ALT: 12 U/L (ref 0–35)
AST: 12 U/L (ref 0–37)
Albumin: 4.4 g/dL (ref 3.5–5.2)
Alkaline Phosphatase: 95 U/L (ref 39–117)
BUN: 18 mg/dL (ref 6–23)
CO2: 29 mEq/L (ref 19–32)
Calcium: 9.7 mg/dL (ref 8.4–10.5)
Chloride: 102 mEq/L (ref 96–112)
Creatinine, Ser: 0.84 mg/dL (ref 0.40–1.20)
GFR: 69.52 mL/min (ref >60.00)
Glucose, Bld: 104 mg/dL — ABNORMAL HIGH (ref 70–99)
Potassium: 3.8 mEq/L (ref 3.5–5.1)
Sodium: 140 mEq/L (ref 135–145)
Total Bilirubin: 0.5 mg/dL (ref 0.2–1.2)
Total Protein: 7.3 g/dL (ref 6.0–8.3)

## 2019-09-27 LAB — IBC + FERRITIN
Ferritin: 6.8 ng/mL — ABNORMAL LOW (ref 10.0–291.0)
Iron: 31 ug/dL — ABNORMAL LOW (ref 42–145)
Saturation Ratios: 6.2 % — ABNORMAL LOW (ref 20.0–50.0)
Transferrin: 355 mg/dL (ref 212.0–360.0)

## 2019-09-27 LAB — LIPID PANEL
Cholesterol: 197 mg/dL (ref 0–200)
HDL: 59.6 mg/dL (ref >39.00)
LDL Cholesterol: 114 mg/dL — ABNORMAL HIGH (ref 0–99)
NonHDL: 137.24
Total CHOL/HDL Ratio: 3
Triglycerides: 115 mg/dL (ref 0.0–149.0)
VLDL: 23 mg/dL (ref 0.0–40.0)

## 2019-09-27 LAB — CBC
HCT: 37.9 % (ref 36.0–46.0)
Hemoglobin: 12.3 g/dL (ref 12.0–15.0)
MCHC: 32.6 g/dL (ref 30.0–36.0)
MCV: 80.4 fl (ref 78.0–100.0)
Platelets: 430 10*3/uL — ABNORMAL HIGH (ref 150.0–400.0)
RBC: 4.71 Mil/uL (ref 3.87–5.11)
RDW: 15.9 % — ABNORMAL HIGH (ref 11.5–15.5)
WBC: 6.8 10*3/uL (ref 4.0–10.5)

## 2019-09-27 LAB — TSH: TSH: 2.26 u[IU]/mL (ref 0.35–4.50)

## 2019-09-27 LAB — VITAMIN D 25 HYDROXY (VIT D DEFICIENCY, FRACTURES): VITD: 24.17 ng/mL — ABNORMAL LOW (ref 30.00–100.00)

## 2019-09-27 LAB — B12 AND FOLATE PANEL
Folate: >24.1 ng/mL (ref >5.9)
Vitamin B-12: 486 pg/mL (ref 211–911)

## 2019-09-27 MED ORDER — VALACYCLOVIR HCL 1 G PO TABS: 2000.0000 mg | ORAL_TABLET | Freq: Two times a day (BID) | ORAL | 2 refills | Status: DC

## 2019-09-27 NOTE — Progress Notes (Signed)
Subjective:    Patient ID: Brooke Harmon, female    DOB: 1961-09-07, 58 y.o.   MRN: [id number]  HPI   Patient presents to clinic for complete physical exam.  Overall she is feeling well.  Only complaint is some sores in her mouth, does have a history of cold sores but sometimes feels as if she is almost biting the sides of her mouth.  Denies any pain in teeth or any loose or broken teeth.  Does see dentist every 6 months regularly.  Usually sees eye doctor annually, wears glasses.  Colonoscopy due in 2022  Flu vaccine done last week at [facility]  Mammogram done 05/2019  Pap smear done 05/2019 at [facility] [place]  Patient Active Problem List   Diagnosis Date Noted  . Sciatica of left side 10/27/2018  . Vitamin D deficiency 09/23/2018  . Routine physical examination 09/01/2017  . Bipolar depression ([place]) 08/18/2015  . Allergic rhinitis 02/09/2014  . Mixed hyperlipidemia 02/09/2014  . Essential hypertension 04/01/2012  . Psoriasis, guttate 04/01/2012   Social History   Tobacco Use  . Smoking status: Never Smoker  . Smokeless tobacco: Never Used  Substance Use Topics  . Alcohol use: Yes   Review of Systems  Constitutional: Negative for chills, fatigue and fever.  HENT: Negative for congestion, ear pain, sinus pain and sore throat.   Eyes: Negative.   Respiratory: Negative for cough, shortness of breath and wheezing.   Cardiovascular: Negative for chest pain, palpitations and leg swelling.  Gastrointestinal: Negative for abdominal pain, diarrhea, nausea and vomiting.  Genitourinary: Negative for dysuria, frequency and urgency.  Musculoskeletal: Negative for arthralgias and myalgias.  Skin: Negative for color change, pallor and rash.  Neurological: Negative for syncope, light-headedness and headaches.  Psychiatric/Behavioral: The patient is not nervous/anxious.       Objective:   Physical Exam Vitals signs and nursing note reviewed.  Constitutional:      General: She is not in acute distress.    Appearance: She is not ill-appearing, toxic-appearing or diaphoretic.  HENT:     Head: Normocephalic and atraumatic.     Right Ear: Tympanic membrane, ear canal and external ear normal.     Left Ear: Tympanic membrane, ear canal and external ear normal.     Mouth/Throat:     Comments: Some ?bite marks on bilateral cheeks - some white skin on buccal mucosa - appears in a horizontal line following teeth so could be from biting inside of cheek No obvious sores in mouth now that would look like oral herpes Eyes:     General: No scleral icterus.    Extraocular Movements: Extraocular movements intact.     Pupils: Pupils are equal, round, and reactive to light.  Neck:     Musculoskeletal: Normal range of motion and neck supple.  Cardiovascular:     Rate and Rhythm: Normal rate and regular rhythm.     Heart sounds: Normal heart sounds.  Pulmonary:     Effort: Pulmonary effort is normal. No respiratory distress.     Breath sounds: Normal breath sounds.  Abdominal:     General: Abdomen is flat. There is no distension.     Palpations: Abdomen is soft.     Tenderness: There is no abdominal tenderness. There is no right CVA tenderness, left CVA tenderness, guarding or rebound.  Neurological:     General: No focal deficit present.     Mental Status: She is alert and oriented to person, place,  and time.  Psychiatric:        Mood and Affect: Mood normal.        Behavior: Behavior normal.    Today's Vitals   09/27/19 0815  BP: 120/82  Pulse: 65  Temp: 98.4 F (36.9 C)  SpO2: 99%  Weight: 148 lb (67.1 kg)  Height: 5' 2.75" (1.594 m)   Body mass index is 26.43 kg/m.     Assessment & Plan:     Well adult exam - patient is a healthy-appearing 58 year old female.  Colon cancer screening up-to-date.  Mammogram up-to-date and Pap smear up-to-date.  We will get blood work in clinic today.  Patient healthy diet and regular physical activity,  recommended diet high in lean proteins, various fruits and vegetables and whole grains and good water intake as well as regular walking and lifting a small weights to keep body physically fit.  Always wear seatbelt in car.  Discussed safe sun practices including wearing SPF of at least 30 when outdoors.  Does see dentist regularly and eye doctor regularly.   History of cold sores-unsure of the areas in mouth, could be related to a cold sore.  She will reach out to her dentist for further evaluation.  She is also doing daily notes rinses of 1 or hydrogen peroxide water, and often spit out.  She will use antiviral drug Valtrex high-dose x1 today to see if this helps calm down the sore feeling in mouth.  Refill Valtrex given for future ulcer outbreaks.    Patient will follow-up approximately every 6 months that she regularly does with PCP.  She will reach out to clinic anytime with questions or concerns.

## 2019-09-27 NOTE — Patient Instructions (Signed)
Rinse mouth with one part hydrogen peroxide and one part water, swish in mouth for 20 seconds and spit out. Do this once per day.   Preventive Dental Care, Adult Preventive dental care includes seeing a dentist regularly and practicing good dental care (oral hygiene) at home. These actions can help to prevent cavities, root canal problems, gum disease (gingivitis), tooth loss, and other tooth problems. Regular dental exams may also help your health care provider diagnose other medical problems. Many diseases, including mouth cancers, have early signs that can be found during a preventive dental care visit. What can I expect for my preventive dental care visit? Counseling At your visit, your dentist may ask you about:  Your overall health and diet.  Any new symptoms, such as: ? Bleeding gums. ? Mouth, tooth, or jaw pain. ? Dull headache.  Using a mouthguard for sports or because of teeth grinding.  The need or desire to get braces to straighten teeth (orthodontic care). Physical exam Your dentist will do a mouth (oral) exam to check for:  Cavities.  Gum disease or other problems.  Signs of cancer.  Neck swelling or lumps.  Abnormal jaw movement or pain in the jaw joint.  Signs of teeth grinding, such as loose or fractured teeth. Other services You may also have:  Dental X-rays.  Your teeth cleaned. Follow these instructions at home: Oral health      Brush with fluoride toothpaste every morning and night. If possible, brush within 10 minutes after every meal. Ask your dentist for toothpaste recommendations.  Floss at least one time every day.  Check your teeth for white or brown spots after brushing. These may be signs of cavities.  Check your gums for swelling or bleeding. These may be signs of gum disease.  Take over-the-counter and prescription medicines only as told by your dentist.  If you have a permanent tooth knocked out: ? Find the tooth. ? Pick it up  by the top (crown) with a tissue or gauze. ? Wash the tooth for no more than 10 seconds under cold, running water. ? Try to put the tooth back into the gum socket. ? Put the tooth in a glass of milk if you cannot get it back in place. ? Go to your dentist or an emergency department right away. Take the tooth with you. Eating and drinking  Eat a diet that includes plenty of fruits, vegetables, milk and dairy products, whole grains, and proteins. Do not eat a lot of starchy foods or foods with added sugar. Talk with your health care provider if you have questions about following a healthy diet.  Avoid sodas, sugary snacks, and sticky candies.  Choose water or milk instead of fruit juice, sodas, or sports drinks. General instructions  Do not use any products that contain nicotine or tobacco, such as cigarettes, e-cigarettes, and chewing tobacco. If you need help quitting, ask your health care provider.  Do not get mouth piercings.  Always wear a mouthguard when playing contact or collision sports. For more information:  American Dental Association: www.mouthhealthy.org Contact a dentist if you have:  Gum, tooth, or jaw pain.  Red, swollen, or bleeding gums.  A tooth or teeth that are very sensitive to hot or cold.  Very bad breath.  A problem with a filling, crown, implant, or denture.  A broken or loose tooth.  A growth or sore in your mouth that is not going away.  A dry mouth. What's next?  See  your dentist one or two times each year for oral exams and cleanings.  If you have an early problem, like a cavity, your dentist will schedule time for you to get treatment. If you have a tooth root problem, gum disease, or a sign of another disease, your dentist may send you to see another health care provider for care. This information is not intended to replace advice given to you by your health care provider. Make sure you discuss any questions you have with your health care  provider. Document Released: 08/09/2015 Document Revised: 09/11/2018 Document Reviewed: 07/06/2018 Elsevier Patient Education  2020 [employer].

## 2019-09-28 NOTE — Addendum Note (Signed)
Addended by: Philis Nettle on: 09/28/2019 03:39 PM   Modules accepted: Orders

## 2019-10-11 ENCOUNTER — Other Ambulatory Visit: Payer: Self-pay

## 2019-10-18 ENCOUNTER — Other Ambulatory Visit: Payer: Self-pay

## 2019-10-18 ENCOUNTER — Other Ambulatory Visit (INDEPENDENT_AMBULATORY_CARE_PROVIDER_SITE_OTHER): Payer: BC Managed Care – PPO

## 2019-10-18 DIAGNOSIS — R7989 Other specified abnormal findings of blood chemistry: Secondary | ICD-10-CM | POA: Diagnosis not present

## 2019-10-18 LAB — CBC WITH DIFFERENTIAL/PLATELET
Basophils Absolute: 0.1 10*3/uL (ref 0.0–0.1)
Basophils Relative: 1.2 % (ref 0.0–3.0)
Eosinophils Absolute: 0.1 10*3/uL (ref 0.0–0.7)
Eosinophils Relative: 1.8 % (ref 0.0–5.0)
HCT: 39.5 % (ref 36.0–46.0)
Hemoglobin: 12.9 g/dL (ref 12.0–15.0)
Lymphocytes Relative: 34.1 % (ref 12.0–46.0)
Lymphs Abs: 2.8 10*3/uL (ref 0.7–4.0)
MCHC: 32.5 g/dL (ref 30.0–36.0)
MCV: 82.7 fl (ref 78.0–100.0)
Monocytes Absolute: 0.9 10*3/uL (ref 0.1–1.0)
Monocytes Relative: 11.3 % (ref 3.0–12.0)
Neutro Abs: 4.2 10*3/uL (ref 1.4–7.7)
Neutrophils Relative %: 51.6 % (ref 43.0–77.0)
Platelets: 427 10*3/uL — ABNORMAL HIGH (ref 150.0–400.0)
RBC: 4.78 Mil/uL (ref 3.87–5.11)
RDW: 18.2 % — ABNORMAL HIGH (ref 11.5–15.5)
WBC: 8.2 10*3/uL (ref 4.0–10.5)

## 2019-10-19 NOTE — Addendum Note (Signed)
Addended by: Philis Nettle on: 10/19/2019 12:02 PM   Modules accepted: Orders

## 2019-10-19 NOTE — Progress Notes (Signed)
hematolgy referral

## 2019-10-22 DIAGNOSIS — D473 Essential (hemorrhagic) thrombocythemia: Secondary | ICD-10-CM | POA: Insufficient documentation

## 2019-10-22 DIAGNOSIS — D75839 Thrombocytosis, unspecified: Secondary | ICD-10-CM | POA: Insufficient documentation

## 2019-10-22 NOTE — Progress Notes (Signed)
[place]  Telephone:([phone] [phone]) [phone]  ID: Brooke Harmon OB: [birth year]  MR#: [id number]  ID:[id number]  Patient Care Team: Burnard Hawthorne, FNP as PCP - General (Family Medicine)  CHIEF COMPLAINT: Thrombocytosis.  INTERVAL HISTORY: Patient is a 58 year old female who was noted to have iron deficiency anemia as well as increased platelet count on routine blood work.  She currently feels well and is asymptomatic.  She has no neurologic complaints.  She denies any recent fevers or illnesses.  She has a good appetite and denies weight loss.  She has no chest pain, shortness of breath, cough, or hemoptysis.  She denies any nausea, vomiting, constipation, or diarrhea.  She has no melena or hematochezia.  She has no urinary complaints.  Patient feels at her baseline offers no specific complaints today.  REVIEW OF SYSTEMS:   Review of Systems  Constitutional: Negative.  Negative for fever, malaise/fatigue and weight loss.  Respiratory: Negative.  Negative for cough, hemoptysis and shortness of breath.   Cardiovascular: Negative.  Negative for chest pain and leg swelling.  Gastrointestinal: Negative.  Negative for blood in stool and melena.  Genitourinary: Negative.  Negative for hematuria.  Musculoskeletal: Negative.  Negative for back pain.  Skin: Negative.  Negative for rash.  Neurological: Negative.  Negative for dizziness, focal weakness, weakness and headaches.  Psychiatric/Behavioral: Negative.  The patient is not nervous/anxious.     As per HPI. Otherwise, a complete review of systems is negative.  PAST MEDICAL HISTORY: Past Medical History:  Diagnosis Date  . Chicken pox   . Depression   . Hypertension   . Migraines   . Psoriasis     PAST SURGICAL HISTORY: Past Surgical History:  Procedure Laterality Date  . CESAREAN SECTION      FAMILY HISTORY: Family History  Problem Relation Age of Onset  . Hypertension Mother   .  Mental illness Mother   . Cancer Father        prostate  . Hypertension Father   . Mental illness Father   . Diabetes Father     ADVANCED DIRECTIVES (Y/N):  N  HEALTH MAINTENANCE: Social History   Tobacco Use  . Smoking status: Never Smoker  . Smokeless tobacco: Never Used  Substance Use Topics  . Alcohol use: Yes  . Drug use: No     Colonoscopy:  PAP:  Bone density:  Lipid panel:  Allergies  Allergen Reactions  . Lexapro  [Escitalopram Oxalate] Other (See Comments)    Pt reports she had sleeplessness, delusional thinking and self cutting  . Lisinopril     cough    Current Outpatient Medications  Medication Sig Dispense Refill  . Cholecalciferol (VITAMIN D) 50 MCG (2000 UT) tablet Take 2,000 Units by mouth daily.    Marland Kitchen FERROUS SULFATE PO Take 28 mg by mouth.     . lamoTRIgine (LAMICTAL) 100 MG tablet Take 100 mg by mouth at bedtime.  1  . LATUDA 20 MG TABS tablet TAKE 1 TABLET BY MOUTH WITH A MEAL  1  . losartan-hydrochlorothiazide (HYZAAR) 100-25 MG tablet Take 1 tablet by mouth daily. 90 tablet 4  . pimecrolimus (ELIDEL) 1 % cream Apply topically 2 (two) times daily.    . tacrolimus (PROTOPIC) 0.1 % ointment tacrolimus 0.1 % topical ointment    . tacrolimus (PROTOPIC) 0.1 % ointment Apply topically 2 (two) times daily.    . hydrochlorothiazide (HYDRODIURIL) 25 MG tablet Take 25 mg by mouth daily.    Marland Kitchen  valACYclovir (VALTREX) 1000 MG tablet Take 2 tablets (2,000 mg total) by mouth 2 (two) times daily. Use AS NEEDED for cold sore out break 4 tablet 2   No current facility-administered medications for this visit.     OBJECTIVE: Vitals:   10/26/19 1105  BP: 135/87  Pulse: 88  Resp: 20  Temp: 98.9 F (37.2 C)  SpO2: 99%     Body mass index is 26.84 kg/m.    ECOG FS:0 - Asymptomatic  General: Well-developed, well-nourished, no acute distress. Eyes: Pink conjunctiva, anicteric sclera. HEENT: Normocephalic, moist mucous membranes, clear oropharnyx. Lungs:  Clear to auscultation bilaterally. Heart: Regular rate and rhythm. No rubs, murmurs, or gallops. Abdomen: Soft, nontender, nondistended. No organomegaly noted, normoactive bowel sounds. Musculoskeletal: No edema, cyanosis, or clubbing. Neuro: Alert, answering all questions appropriately. Cranial nerves grossly intact. Skin: No rashes or petechiae noted. Psych: Normal affect. Lymphatics: No cervical, calvicular, axillary or inguinal LAD.   LAB RESULTS:  Lab Results  Component Value Date   NA 140 09/27/2019   K 3.8 09/27/2019   CL 102 09/27/2019   CO2 29 09/27/2019   GLUCOSE 104 (H) 09/27/2019   BUN 18 09/27/2019   CREATININE 0.84 09/27/2019   CALCIUM 9.7 09/27/2019   PROT 7.3 09/27/2019   ALBUMIN 4.4 09/27/2019   AST 12 09/27/2019   ALT 12 09/27/2019   ALKPHOS 95 09/27/2019   BILITOT 0.5 09/27/2019    Lab Results  Component Value Date   WBC 9.1 10/26/2019   NEUTROABS 5.8 10/26/2019   HGB 12.5 10/26/2019   HCT 40.5 10/26/2019   MCV 85.1 10/26/2019   PLT 377 10/26/2019   Lab Results  Component Value Date   IRON 199 (H) 10/26/2019   TIBC 419 10/26/2019   IRONPCTSAT 48 (H) 10/26/2019   Lab Results  Component Value Date   FERRITIN 12 10/26/2019    STUDIES: No results found.  ASSESSMENT: Thrombocytosis.  PLAN:  1. Thrombocytosis: Resolved.  Likely secondary to iron deficiency which has now resolved with oral iron supplementation, although she continues to have a mildly decreased ferritin level.  Patient has been instructed to continue her oral iron supplementation.  JAK2 mutation has been ordered for completeness.  Will recheck laboratory work in 6 weeks followed by video assisted telemedicine visit.  If all of her lab work continues to remain within normal limits she likely can be discharged from clinic. 2.  Iron deficiency without anemia: Improved.  Continue oral iron supplementation repeat laboratory work as above.  I spent a total of 45 minutes face-to-face  with the patient of which greater than 50% of the visit was spent in counseling and coordination of care as detailed above.   Patient expressed understanding and was in agreement with this plan. She also understands that She can call clinic at any time with any questions, concerns, or complaints.    Lloyd Huger, MD   10/26/2019 6:04 PM

## 2019-10-25 ENCOUNTER — Other Ambulatory Visit: Payer: Self-pay

## 2019-10-26 ENCOUNTER — Encounter: Payer: Self-pay | Admitting: Oncology

## 2019-10-26 ENCOUNTER — Inpatient Hospital Stay: Payer: BC Managed Care – PPO | Attending: Oncology | Admitting: Oncology

## 2019-10-26 ENCOUNTER — Inpatient Hospital Stay: Payer: BC Managed Care – PPO

## 2019-10-26 ENCOUNTER — Other Ambulatory Visit: Payer: Self-pay

## 2019-10-26 DIAGNOSIS — D75839 Thrombocytosis, unspecified: Secondary | ICD-10-CM

## 2019-10-26 DIAGNOSIS — I10 Essential (primary) hypertension: Secondary | ICD-10-CM

## 2019-10-26 DIAGNOSIS — Z8249 Family history of ischemic heart disease and other diseases of the circulatory system: Secondary | ICD-10-CM | POA: Diagnosis not present

## 2019-10-26 DIAGNOSIS — D473 Essential (hemorrhagic) thrombocythemia: Secondary | ICD-10-CM

## 2019-10-26 DIAGNOSIS — D509 Iron deficiency anemia, unspecified: Secondary | ICD-10-CM | POA: Diagnosis not present

## 2019-10-26 DIAGNOSIS — R7989 Other specified abnormal findings of blood chemistry: Secondary | ICD-10-CM | POA: Diagnosis present

## 2019-10-26 DIAGNOSIS — Z79899 Other long term (current) drug therapy: Secondary | ICD-10-CM | POA: Diagnosis not present

## 2019-10-26 LAB — CBC WITH DIFFERENTIAL/PLATELET
Abs Immature Granulocytes: 0.03 10*3/uL (ref 0.00–0.07)
Basophils Absolute: 0.1 10*3/uL (ref 0.0–0.1)
Basophils Relative: 1 %
Eosinophils Absolute: 0.1 10*3/uL (ref 0.0–0.5)
Eosinophils Relative: 1 %
HCT: 40.5 % (ref 36.0–46.0)
Hemoglobin: 12.5 g/dL (ref 12.0–15.0)
Immature Granulocytes: 0 %
Lymphocytes Relative: 24 %
Lymphs Abs: 2.2 10*3/uL (ref 0.7–4.0)
MCH: 26.3 pg (ref 26.0–34.0)
MCHC: 30.9 g/dL (ref 30.0–36.0)
MCV: 85.1 fL (ref 80.0–100.0)
Monocytes Absolute: 1 10*3/uL (ref 0.1–1.0)
Monocytes Relative: 11 %
Neutro Abs: 5.8 10*3/uL (ref 1.7–7.7)
Neutrophils Relative %: 63 %
Platelets: 377 10*3/uL (ref 150–400)
RBC: 4.76 MIL/uL (ref 3.87–5.11)
RDW: 17 % — ABNORMAL HIGH (ref 11.5–15.5)
WBC: 9.1 10*3/uL (ref 4.0–10.5)
nRBC: 0 % (ref 0.0–0.2)

## 2019-10-26 LAB — FERRITIN: Ferritin: 12 ng/mL (ref 11–307)

## 2019-10-26 LAB — IRON AND TIBC
Iron: 199 ug/dL — ABNORMAL HIGH (ref 28–170)
Saturation Ratios: 48 % — ABNORMAL HIGH (ref 10.4–31.8)
TIBC: 419 ug/dL (ref 250–450)
UIBC: 220 ug/dL

## 2019-10-26 NOTE — Progress Notes (Signed)
Patient is here for a consult from her PCP for elevated Platelet count.  No concerns at this time.

## 2019-11-11 LAB — JAK2  V617F QUAL. WITH REFLEX TO EXON 12: Reflex:: 15

## 2019-11-11 LAB — JAK2 EXONS 12-15

## 2019-11-16 ENCOUNTER — Other Ambulatory Visit: Payer: Self-pay | Admitting: Family

## 2019-12-01 NOTE — Progress Notes (Signed)
[facility]  [phone] [phone]  ID: Brooke Harmon OB: [birth year]  MR#: [id number]  [id number]  Patient Care Team: Burnard Hawthorne, FNP as PCP - General (Family Medicine)  I connected with Brooke Harmon on 12/07/19 at  1:30 PM EST by video enabled telemedicine visit and verified that I am speaking with the correct person using two identifiers.   I discussed the limitations, risks, security and privacy concerns of performing an evaluation and management service by telemedicine and the availability of in-person appointments. I also discussed with the patient that there may be a patient responsible charge related to this service. The patient expressed understanding and agreed to proceed.   Other persons participating in the visit and their role in the encounter: Patient, MD.  Patient's location: Home. Provider's location: Clinic.  CHIEF COMPLAINT: Thrombocytosis.  INTERVAL HISTORY: Patient agreed to video enabled telemedicine visit for discussion of her laboratory work and further evaluation.  She continues to feel well and remains asymptomatic.  She denies any weakness or fatigue. She has no neurologic complaints.  She denies any recent fevers or illnesses.  She has a good appetite and denies weight loss.  She has no chest pain, shortness of breath, cough, or hemoptysis.  She denies any nausea, vomiting, constipation, or diarrhea.  She has no melena or hematochezia.  She has no urinary complaints.  Patient offers no specific complaints today.  REVIEW OF SYSTEMS:   Review of Systems  Constitutional: Negative.  Negative for fever, malaise/fatigue and weight loss.  Respiratory: Negative.  Negative for cough, hemoptysis and shortness of breath.   Cardiovascular: Negative.  Negative for chest pain and leg swelling.  Gastrointestinal: Negative.  Negative for blood in stool and melena.  Genitourinary: Negative.  Negative for  hematuria.  Musculoskeletal: Negative.  Negative for back pain.  Skin: Negative.  Negative for rash.  Neurological: Negative.  Negative for dizziness, focal weakness, weakness and headaches.  Psychiatric/Behavioral: Negative.  The patient is not nervous/anxious.     As per HPI. Otherwise, a complete review of systems is negative.  PAST MEDICAL HISTORY: Past Medical History:  Diagnosis Date  . Chicken pox   . Depression   . Hypertension   . Migraines   . Psoriasis     PAST SURGICAL HISTORY: Past Surgical History:  Procedure Laterality Date  . CESAREAN SECTION      FAMILY HISTORY: Family History  Problem Relation Age of Onset  . Hypertension Mother   . Mental illness Mother   . Cancer Father        prostate  . Hypertension Father   . Mental illness Father   . Diabetes Father     ADVANCED DIRECTIVES (Y/N):  N  HEALTH MAINTENANCE: Social History   Tobacco Use  . Smoking status: Never Smoker  . Smokeless tobacco: Never Used  Substance Use Topics  . Alcohol use: Yes  . Drug use: No     Colonoscopy:  PAP:  Bone density:  Lipid panel:  Allergies  Allergen Reactions  . Lexapro  [Escitalopram Oxalate] Other (See Comments)    Pt reports she had sleeplessness, delusional thinking and self cutting  . Lisinopril     cough    Current Outpatient Medications  Medication Sig Dispense Refill  . Cholecalciferol (VITAMIN D) 50 MCG (2000 UT) tablet Take 2,000 Units by mouth daily.    Marland Kitchen FERROUS SULFATE PO Take 28 mg by mouth.     . hydrochlorothiazide (HYDRODIURIL) 25 MG  tablet TAKE 1 TABLET BY MOUTH DAILY 90 tablet 1  . lamoTRIgine (LAMICTAL) 100 MG tablet Take 100 mg by mouth at bedtime.  1  . LATUDA 20 MG TABS tablet TAKE 1 TABLET BY MOUTH WITH A MEAL  1  . losartan (COZAAR) 100 MG tablet TAKE 1 TABLET BY MOUTH DAILY 90 tablet 1  . losartan-hydrochlorothiazide (HYZAAR) 100-25 MG tablet Take 1 tablet by mouth daily. 90 tablet 4  . neomycin-polymyxin-dexamethasone  (MAXITROL) 0.1 % ophthalmic suspension neomycin-polymyxin-dexameth 3.5 mg/mL-10,000 unit/mL-0.1% eye drops    . pimecrolimus (ELIDEL) 1 % cream Apply topically 2 (two) times daily.    . tacrolimus (PROTOPIC) 0.1 % ointment tacrolimus 0.1 % topical ointment    . tacrolimus (PROTOPIC) 0.1 % ointment Apply topically 2 (two) times daily.    . valACYclovir (VALTREX) 1000 MG tablet Take 2 tablets (2,000 mg total) by mouth 2 (two) times daily. Use AS NEEDED for cold sore out break 4 tablet 2   No current facility-administered medications for this visit.    OBJECTIVE: There were no vitals filed for this visit.   There is no height or weight on file to calculate BMI.    ECOG FS:0 - Asymptomatic  General: Well-developed, well-nourished, no acute distress. HEENT: Normocephalic. Neuro: Alert, answering all questions appropriately. Cranial nerves grossly intact. Psych: Normal affect.   LAB RESULTS:  Lab Results  Component Value Date   NA 140 09/27/2019   K 3.8 09/27/2019   CL 102 09/27/2019   CO2 29 09/27/2019   GLUCOSE 104 (H) 09/27/2019   BUN 18 09/27/2019   CREATININE 0.84 09/27/2019   CALCIUM 9.7 09/27/2019   PROT 7.3 09/27/2019   ALBUMIN 4.4 09/27/2019   AST 12 09/27/2019   ALT 12 09/27/2019   ALKPHOS 95 09/27/2019   BILITOT 0.5 09/27/2019    Lab Results  Component Value Date   WBC 7.1 12/06/2019   NEUTROABS 4.4 12/06/2019   HGB 12.7 12/06/2019   HCT 41.3 12/06/2019   MCV 89.2 12/06/2019   PLT 367 12/06/2019   Lab Results  Component Value Date   IRON 40 12/06/2019   TIBC 413 12/06/2019   IRONPCTSAT 10 (L) 12/06/2019   Lab Results  Component Value Date   FERRITIN 15 12/06/2019    STUDIES: No results found.  ASSESSMENT: Thrombocytosis.  PLAN:  1. Thrombocytosis: Resolved.  Patient's platelet count continues to be within normal limits at 367 today.  JAK2 mutation is negative.  Most likely etiology was a mild iron deficiency that is now resolved.  She has been  instructed to continue her oral iron supplementation.  No further intervention is needed.  No further follow-up has been scheduled.  Please refer patient back if there are any questions or concerns.  2.  Iron deficiency without anemia: Patient noted to have a mildly decreased iron saturation level which is possibly secondary to recently donating blood.  Continue oral iron supplementation as above.  I provided 15 minutes of face-to-face video visit time during this encounter which included chart review. Greater than 50% was spent counseling as documented under my assessment & plan.   Patient expressed understanding and was in agreement with this plan. She also understands that She can call clinic at any time with any questions, concerns, or complaints.    Lloyd Huger, MD   12/07/2019 3:35 PM

## 2019-12-06 ENCOUNTER — Other Ambulatory Visit: Payer: Self-pay

## 2019-12-06 ENCOUNTER — Inpatient Hospital Stay: Payer: BC Managed Care – PPO | Attending: Oncology

## 2019-12-06 DIAGNOSIS — I10 Essential (primary) hypertension: Secondary | ICD-10-CM | POA: Insufficient documentation

## 2019-12-06 DIAGNOSIS — Z79899 Other long term (current) drug therapy: Secondary | ICD-10-CM | POA: Insufficient documentation

## 2019-12-06 DIAGNOSIS — D75839 Thrombocytosis, unspecified: Secondary | ICD-10-CM

## 2019-12-06 DIAGNOSIS — Z8249 Family history of ischemic heart disease and other diseases of the circulatory system: Secondary | ICD-10-CM | POA: Insufficient documentation

## 2019-12-06 DIAGNOSIS — R7989 Other specified abnormal findings of blood chemistry: Secondary | ICD-10-CM | POA: Insufficient documentation

## 2019-12-06 DIAGNOSIS — Z7952 Long term (current) use of systemic steroids: Secondary | ICD-10-CM | POA: Diagnosis not present

## 2019-12-06 DIAGNOSIS — Z833 Family history of diabetes mellitus: Secondary | ICD-10-CM | POA: Insufficient documentation

## 2019-12-06 DIAGNOSIS — D473 Essential (hemorrhagic) thrombocythemia: Secondary | ICD-10-CM

## 2019-12-06 LAB — CBC WITH DIFFERENTIAL/PLATELET
Abs Immature Granulocytes: 0.02 10*3/uL (ref 0.00–0.07)
Basophils Absolute: 0.1 10*3/uL (ref 0.0–0.1)
Basophils Relative: 1 %
Eosinophils Absolute: 0.1 10*3/uL (ref 0.0–0.5)
Eosinophils Relative: 1 %
HCT: 41.3 % (ref 36.0–46.0)
Hemoglobin: 12.7 g/dL (ref 12.0–15.0)
Immature Granulocytes: 0 %
Lymphocytes Relative: 25 %
Lymphs Abs: 1.7 10*3/uL (ref 0.7–4.0)
MCH: 27.4 pg (ref 26.0–34.0)
MCHC: 30.8 g/dL (ref 30.0–36.0)
MCV: 89.2 fL (ref 80.0–100.0)
Monocytes Absolute: 0.8 10*3/uL (ref 0.1–1.0)
Monocytes Relative: 11 %
Neutro Abs: 4.4 10*3/uL (ref 1.7–7.7)
Neutrophils Relative %: 62 %
Platelets: 367 10*3/uL (ref 150–400)
RBC: 4.63 MIL/uL (ref 3.87–5.11)
RDW: 17.2 % — ABNORMAL HIGH (ref 11.5–15.5)
WBC: 7.1 10*3/uL (ref 4.0–10.5)
nRBC: 0 % (ref 0.0–0.2)

## 2019-12-06 LAB — FERRITIN: Ferritin: 15 ng/mL (ref 11–307)

## 2019-12-06 LAB — IRON AND TIBC
Iron: 40 ug/dL (ref 28–170)
Saturation Ratios: 10 % — ABNORMAL LOW (ref 10.4–31.8)
TIBC: 413 ug/dL (ref 250–450)
UIBC: 373 ug/dL

## 2019-12-07 ENCOUNTER — Inpatient Hospital Stay (HOSPITAL_BASED_OUTPATIENT_CLINIC_OR_DEPARTMENT_OTHER): Payer: BC Managed Care – PPO | Admitting: Oncology

## 2019-12-07 ENCOUNTER — Encounter: Payer: Self-pay | Admitting: Oncology

## 2019-12-07 DIAGNOSIS — D473 Essential (hemorrhagic) thrombocythemia: Secondary | ICD-10-CM

## 2019-12-07 DIAGNOSIS — Z79899 Other long term (current) drug therapy: Secondary | ICD-10-CM | POA: Diagnosis not present

## 2019-12-07 DIAGNOSIS — D75839 Thrombocytosis, unspecified: Secondary | ICD-10-CM

## 2019-12-07 DIAGNOSIS — I10 Essential (primary) hypertension: Secondary | ICD-10-CM | POA: Diagnosis not present

## 2019-12-08 ENCOUNTER — Ambulatory Visit: Payer: BC Managed Care – PPO | Admitting: Family

## 2020-05-10 ENCOUNTER — Other Ambulatory Visit: Payer: Self-pay | Admitting: Family

## 2020-06-16 LAB — HM COLONOSCOPY

## 2020-10-10 ENCOUNTER — Other Ambulatory Visit: Payer: Self-pay

## 2020-10-10 ENCOUNTER — Telehealth (INDEPENDENT_AMBULATORY_CARE_PROVIDER_SITE_OTHER): Payer: BC Managed Care – PPO | Admitting: Family

## 2020-10-10 ENCOUNTER — Encounter: Payer: Self-pay | Admitting: Family

## 2020-10-10 VITALS — Ht 62.0 in | Wt 147.0 lb

## 2020-10-10 DIAGNOSIS — R1031 Right lower quadrant pain: Secondary | ICD-10-CM

## 2020-10-10 DIAGNOSIS — I10 Essential (primary) hypertension: Secondary | ICD-10-CM | POA: Diagnosis not present

## 2020-10-10 NOTE — Progress Notes (Signed)
Virtual Visit via Video Note  I connected with@  on 10/11/20 at 11:00 AM EST by a video enabled telemedicine application and verified that I am speaking with the correct person using two identifiers.  Location patient: home Location provider:work  Persons participating in the virtual visit: patient, provider  I discussed the limitations of evaluation and management by telemedicine and the availability of in person appointments. The patient expressed understanding and agreed to proceed.   HPI:  Acute visit CC right sided abdominal pain at waist x one week, unchanged.  'it is bothersome' however not particularly painful. It is 'uncomfortable' Describes as constant. Pain hasnt migrated.Had one episode in which pain was 'gripping' which lasted few seconds when standing several days ago, no episode of that nature since.   She has had similar discomfort before however she felt related to excessive flatus, and self limiting. Pain worse when stands up, rolls over in bed.  Pain when she presses on lower abdomen. Pain improves with activity.   H/o hemorrhoids and has BRB with bowel movement. This is not new for her.   Stool is brown in color. No melena, coffee ground emesis,dysuria, hematuria, nausea, fever, rash, injury or heavy lifting, low back pain, flank pain,  Numbness in legs or groin, epigastric burning, regurgitation, diarrhea, bloating, burping, excessive flatus. Eating and drinking normally.    H/o cesarean section.   No  H/o diverticulitis.   LMP 7 years ago. Endorses vaginal discomfort. No bleeding  HTN- 127/85 at home. Compliant with losartan, hctz  ROS: See pertinent positives and negatives per HPI.    EXAM:  VITALS per patient if applicable: Ht 5\' 2"  (1.575 m)   Wt 147 lb (66.7 kg)   BMI 26.89 kg/m  BP Readings from Last 3 Encounters:  10/26/19 135/87  09/27/19 120/82  10/26/18 136/74   Wt Readings from Last 3 Encounters:  10/10/20 147 lb (66.7 kg)  10/26/19  150 lb 4.8 oz (68.2 kg)  09/27/19 148 lb (67.1 kg)    GENERAL: alert, oriented, appears well and in no acute distress  HEENT: atraumatic, conjunttiva clear, no obvious abnormalities on inspection of external nose and ears  NECK: normal movements of the head and neck  LUNGS: on inspection no signs of respiratory distress, breathing rate appears normal, no obvious gross SOB, gasping or wheezing  CV: no obvious cyanosis  MS: moves all visible extremities without noticeable abnormality. Tenderness when she presses on RLQ. No pain with heel jar test.   PSYCH/NEURO: pleasant and cooperative, no obvious depression or anxiety, speech and thought processing grossly intact  ASSESSMENT AND PLAN:  Discussed the following assessment and plan:  Problem List Items Addressed This Visit      Cardiovascular and Mediastinum   Essential hypertension - Primary    Stable. Continue hctz 25 and losartan 100mg . Pending bmp      Relevant Orders   CBC with Differential/Platelet   Comprehensive metabolic panel   Lipid panel   TSH   VITAMIN D 25 Hydroxy (Vit-D Deficiency, Fractures)   IBC + Ferritin     Other   Abdominal pain    Exam limited by nature of virtual visit. Discussed differentials including musculoskeletal etiology including hip, atypical renal stone, UTI. Less likely appendicitis , diverticulitis. Advised lab, urine and patient declined at this time as she feels comfortable monitoring. We discussed CT ab/p to rule out renal stone, appendicitis, diverticulitis and patient also feels not necessary at this time. Advised her to remain hypervigilant and  call me with any new or worsening symptoms, or if pain doesn't resolve so we can proceed with evaluation.          -we discussed possible serious and likely etiologies, options for evaluation and workup, limitations of telemedicine visit vs in person visit, treatment, treatment risks and precautions. Pt prefers to treat via telemedicine  empirically rather then risking or undertaking an in person visit at this moment.  .   I discussed the assessment and treatment plan with the patient. The patient was provided an opportunity to ask questions and all were answered. The patient agreed with the plan and demonstrated an understanding of the instructions.   The patient was advised to call back or seek an in-person evaluation if the symptoms worsen or if the condition fails to improve as anticipated.  I have spent 21 minutes with a patient including discussion of differentials, symptoms, and  plan of care.       Mable Paris, FNP

## 2020-10-11 DIAGNOSIS — R109 Unspecified abdominal pain: Secondary | ICD-10-CM | POA: Insufficient documentation

## 2020-10-11 NOTE — Patient Instructions (Signed)
Please stay very vigilant in regards to symptoms and let me know of any new or worsening symptoms OR if pain doesn't resolve.

## 2020-10-11 NOTE — Assessment & Plan Note (Addendum)
Exam limited by nature of virtual visit. Discussed differentials including musculoskeletal etiology including hip, atypical renal stone, UTI. Less likely appendicitis , diverticulitis. Advised lab, urine and patient declined at this time as she feels comfortable monitoring. We discussed CT ab/p to rule out renal stone, appendicitis, diverticulitis and patient also feels not necessary at this time. Advised her to remain hypervigilant and call me with any new or worsening symptoms, or if pain doesn't resolve so we can proceed with evaluation.

## 2020-10-11 NOTE — Assessment & Plan Note (Signed)
Stable. Continue hctz 25 and losartan 100mg . Pending bmp

## 2020-11-01 ENCOUNTER — Other Ambulatory Visit: Payer: Self-pay

## 2020-11-05 ENCOUNTER — Other Ambulatory Visit: Payer: Self-pay | Admitting: Family

## 2020-11-07 ENCOUNTER — Encounter: Payer: BC Managed Care – PPO | Admitting: Family

## 2020-11-08 ENCOUNTER — Other Ambulatory Visit (INDEPENDENT_AMBULATORY_CARE_PROVIDER_SITE_OTHER): Payer: BC Managed Care – PPO

## 2020-11-08 ENCOUNTER — Other Ambulatory Visit: Payer: Self-pay

## 2020-11-08 DIAGNOSIS — I10 Essential (primary) hypertension: Secondary | ICD-10-CM | POA: Diagnosis not present

## 2020-11-08 LAB — COMPREHENSIVE METABOLIC PANEL
ALT: 17 U/L (ref 0–35)
AST: 16 U/L (ref 0–37)
Albumin: 4 g/dL (ref 3.5–5.2)
Alkaline Phosphatase: 76 U/L (ref 39–117)
BUN: 15 mg/dL (ref 6–23)
CO2: 31 mEq/L (ref 19–32)
Calcium: 8.8 mg/dL (ref 8.4–10.5)
Chloride: 103 mEq/L (ref 96–112)
Creatinine, Ser: 0.88 mg/dL (ref 0.40–1.20)
GFR: 71.84 mL/min (ref >60.00)
Glucose, Bld: 87 mg/dL (ref 70–99)
Potassium: 3.5 mEq/L (ref 3.5–5.1)
Sodium: 140 mEq/L (ref 135–145)
Total Bilirubin: 0.6 mg/dL (ref 0.2–1.2)
Total Protein: 6.7 g/dL (ref 6.0–8.3)

## 2020-11-08 LAB — CBC WITH DIFFERENTIAL/PLATELET
Basophils Absolute: 0.1 10*3/uL (ref 0.0–0.1)
Basophils Relative: 1.3 % (ref 0.0–3.0)
Eosinophils Absolute: 0.1 10*3/uL (ref 0.0–0.7)
Eosinophils Relative: 1.9 % (ref 0.0–5.0)
HCT: 37.3 % (ref 36.0–46.0)
Hemoglobin: 12.6 g/dL (ref 12.0–15.0)
Lymphocytes Relative: 31.6 % (ref 12.0–46.0)
Lymphs Abs: 1.9 10*3/uL (ref 0.7–4.0)
MCHC: 33.7 g/dL (ref 30.0–36.0)
MCV: 88.2 fl (ref 78.0–100.0)
Monocytes Absolute: 0.6 10*3/uL (ref 0.1–1.0)
Monocytes Relative: 10 % (ref 3.0–12.0)
Neutro Abs: 3.3 10*3/uL (ref 1.4–7.7)
Neutrophils Relative %: 55.2 % (ref 43.0–77.0)
Platelets: 371 10*3/uL (ref 150.0–400.0)
RBC: 4.23 Mil/uL (ref 3.87–5.11)
RDW: 13.7 % (ref 11.5–15.5)
WBC: 5.9 10*3/uL (ref 4.0–10.5)

## 2020-11-08 LAB — IBC + FERRITIN
Ferritin: 12.3 ng/mL (ref 10.0–291.0)
Iron: 51 ug/dL (ref 42–145)
Saturation Ratios: 13.4 % — ABNORMAL LOW (ref 20.0–50.0)
Transferrin: 271 mg/dL (ref 212.0–360.0)

## 2020-11-08 LAB — LIPID PANEL
Cholesterol: 202 mg/dL — ABNORMAL HIGH (ref 0–200)
HDL: 53.1 mg/dL (ref >39.00)
LDL Cholesterol: 128 mg/dL — ABNORMAL HIGH (ref 0–99)
NonHDL: 149.05
Total CHOL/HDL Ratio: 4
Triglycerides: 106 mg/dL (ref 0.0–149.0)
VLDL: 21.2 mg/dL (ref 0.0–40.0)

## 2020-11-08 LAB — TSH: TSH: 2.55 u[IU]/mL (ref 0.35–4.50)

## 2020-11-08 LAB — VITAMIN D 25 HYDROXY (VIT D DEFICIENCY, FRACTURES): VITD: 29.45 ng/mL — ABNORMAL LOW (ref 30.00–100.00)

## 2020-11-14 ENCOUNTER — Other Ambulatory Visit: Payer: Self-pay | Admitting: Family

## 2020-11-14 ENCOUNTER — Encounter: Payer: Self-pay | Admitting: Family

## 2020-11-14 DIAGNOSIS — E559 Vitamin D deficiency, unspecified: Secondary | ICD-10-CM

## 2020-11-14 MED ORDER — CHOLECALCIFEROL 1.25 MG (50000 UT) PO TABS: ORAL_TABLET | ORAL | 0 refills | Status: DC

## 2020-12-15 ENCOUNTER — Ambulatory Visit: Payer: BC Managed Care – PPO | Admitting: Family

## 2021-02-28 ENCOUNTER — Telehealth: Payer: Self-pay

## 2021-02-28 ENCOUNTER — Other Ambulatory Visit: Payer: Self-pay

## 2021-02-28 DIAGNOSIS — E559 Vitamin D deficiency, unspecified: Secondary | ICD-10-CM

## 2021-02-28 NOTE — Telephone Encounter (Signed)
I have ordered per Margaret's okay & patient is scheduled.

## 2021-02-28 NOTE — Telephone Encounter (Signed)
Pt called and states that she needs Vit D labs checked prior to appt with PCP on 4/5

## 2021-03-01 ENCOUNTER — Other Ambulatory Visit (INDEPENDENT_AMBULATORY_CARE_PROVIDER_SITE_OTHER): Payer: BC Managed Care – PPO

## 2021-03-01 ENCOUNTER — Other Ambulatory Visit: Payer: Self-pay

## 2021-03-01 DIAGNOSIS — E559 Vitamin D deficiency, unspecified: Secondary | ICD-10-CM | POA: Diagnosis not present

## 2021-03-02 ENCOUNTER — Other Ambulatory Visit: Payer: Self-pay | Admitting: Family

## 2021-03-02 DIAGNOSIS — E559 Vitamin D deficiency, unspecified: Secondary | ICD-10-CM

## 2021-03-02 LAB — VITAMIN D 25 HYDROXY (VIT D DEFICIENCY, FRACTURES): VITD: 22.6 ng/mL — ABNORMAL LOW (ref 30.00–100.00)

## 2021-03-02 MED ORDER — CHOLECALCIFEROL 1.25 MG (50000 UT) PO TABS: ORAL_TABLET | ORAL | 0 refills | Status: DC

## 2021-03-05 ENCOUNTER — Encounter: Payer: Self-pay | Admitting: Family

## 2021-03-06 ENCOUNTER — Ambulatory Visit: Payer: BC Managed Care – PPO | Admitting: Family

## 2021-03-06 ENCOUNTER — Telehealth: Payer: BC Managed Care – PPO | Admitting: Family

## 2021-03-07 ENCOUNTER — Other Ambulatory Visit: Payer: Self-pay

## 2021-03-07 DIAGNOSIS — E559 Vitamin D deficiency, unspecified: Secondary | ICD-10-CM

## 2021-05-02 ENCOUNTER — Telehealth: Payer: BC Managed Care – PPO | Admitting: Family

## 2021-05-04 ENCOUNTER — Other Ambulatory Visit: Payer: Self-pay | Admitting: Family

## 2021-05-08 ENCOUNTER — Other Ambulatory Visit: Payer: Self-pay | Admitting: Family

## 2021-05-08 DIAGNOSIS — E559 Vitamin D deficiency, unspecified: Secondary | ICD-10-CM

## 2021-05-18 ENCOUNTER — Ambulatory Visit: Payer: BC Managed Care – PPO | Admitting: Family

## 2021-05-25 ENCOUNTER — Other Ambulatory Visit: Payer: Self-pay

## 2021-05-25 ENCOUNTER — Other Ambulatory Visit (INDEPENDENT_AMBULATORY_CARE_PROVIDER_SITE_OTHER): Payer: BC Managed Care – PPO

## 2021-05-25 DIAGNOSIS — E559 Vitamin D deficiency, unspecified: Secondary | ICD-10-CM

## 2021-05-25 LAB — VITAMIN D 25 HYDROXY (VIT D DEFICIENCY, FRACTURES): VITD: 63.92 ng/mL (ref 30.00–100.00)

## 2021-05-26 ENCOUNTER — Other Ambulatory Visit: Payer: Self-pay | Admitting: Family

## 2021-05-28 ENCOUNTER — Encounter: Payer: Self-pay | Admitting: Family

## 2021-06-08 ENCOUNTER — Other Ambulatory Visit: Payer: Self-pay

## 2021-06-08 ENCOUNTER — Encounter: Payer: Self-pay | Admitting: Family

## 2021-06-08 ENCOUNTER — Ambulatory Visit: Payer: BC Managed Care – PPO | Admitting: Family

## 2021-06-08 VITALS — BP 122/74 | HR 106 | Temp 97.9°F | Ht 62.0 in | Wt 154.4 lb

## 2021-06-08 DIAGNOSIS — F319 Bipolar disorder, unspecified: Secondary | ICD-10-CM

## 2021-06-08 DIAGNOSIS — J309 Allergic rhinitis, unspecified: Secondary | ICD-10-CM

## 2021-06-08 DIAGNOSIS — R0981 Nasal congestion: Secondary | ICD-10-CM | POA: Diagnosis not present

## 2021-06-08 DIAGNOSIS — I10 Essential (primary) hypertension: Secondary | ICD-10-CM

## 2021-06-08 DIAGNOSIS — E559 Vitamin D deficiency, unspecified: Secondary | ICD-10-CM

## 2021-06-08 LAB — COMPREHENSIVE METABOLIC PANEL
ALT: 26 U/L (ref 0–35)
AST: 17 U/L (ref 0–37)
Albumin: 4.2 g/dL (ref 3.5–5.2)
Alkaline Phosphatase: 78 U/L (ref 39–117)
BUN: 20 mg/dL (ref 6–23)
CO2: 28 mEq/L (ref 19–32)
Calcium: 9.2 mg/dL (ref 8.4–10.5)
Chloride: 105 mEq/L (ref 96–112)
Creatinine, Ser: 0.89 mg/dL (ref 0.40–1.20)
GFR: 70.58 mL/min (ref >60.00)
Glucose, Bld: 108 mg/dL — ABNORMAL HIGH (ref 70–99)
Potassium: 3.4 mEq/L — ABNORMAL LOW (ref 3.5–5.1)
Sodium: 141 mEq/L (ref 135–145)
Total Bilirubin: 0.4 mg/dL (ref 0.2–1.2)
Total Protein: 7.1 g/dL (ref 6.0–8.3)

## 2021-06-08 MED ORDER — AZELASTINE HCL 0.1 % NA SOLN
1.0000 | Freq: Two times a day (BID) | NASAL | 4 refills | Status: DC
Start: 2021-06-08 — End: 2021-09-05

## 2021-06-08 NOTE — Patient Instructions (Signed)
Trial of azelastine nasal spray ( anti histamine)  Let me know of any new symptoms.

## 2021-06-08 NOTE — Progress Notes (Signed)
Subjective:    Patient ID: Brooke Harmon, female    DOB: 1961/11/15, 60 y.o.   MRN: [id number]  CC: Brooke Harmon is a 60 y.o. female who presents today for follow up.   HPI: Complains of symptom of 'keeping mouth open' in a strange way when she is alone or in situations such as watching tv. Has been intermittent for several months. She does have sinus congestion and wanders if she does this to breath out of her mouth versus her nose. No aggravating or relieving factors. No jaw pain, pain with eating, HA, sinus pain, ear pain, rash, neck pain, fever. She doesn't notice the symptom when she is more anxious. She is not particularly bothered by symptom and wanted to mention today.   She has h/o seasonal allergies.   H/o vitamin d deficiency. Normalized 2 weeks ago  HTN - compliant with losartan 100mg , hctz 25mg . No Cp, sob.  Bipolar- follows with psychiatry, [facility]. Compliant with lamictal and symptoms controlled. On si/hi  Due mammogram, pap smear- she follows with GYN, [facility].   Colonoscopy reported 2021 and reported due 2026    HISTORY:  Past Medical History:  Diagnosis Date   Chicken pox    Depression    Hypertension    Migraines    Psoriasis    Past Surgical History:  Procedure Laterality Date   CESAREAN SECTION     Family History  Problem Relation Age of Onset   Hypertension Mother    Mental illness Mother    Cancer Father        prostate   Hypertension Father    Mental illness Father    Diabetes Father     Allergies: Lexapro  [escitalopram oxalate] and Lisinopril Current Outpatient Medications on File Prior to Visit  Medication Sig Dispense Refill   Cholecalciferol (VITAMIN D3) 20 MCG (800 UNIT) TABS Take 1 capsule by mouth daily.     ferrous sulfate 325 (65 FE) MG tablet Take 325 mg by mouth daily with breakfast.     hydrochlorothiazide (HYDRODIURIL) 25 MG tablet TAKE 1 TABLET BY MOUTH DAILY 90 tablet 1   Ivermectin 1 % CREA Apply  1 application topically daily as needed.     lamoTRIgine (LAMICTAL) 200 MG tablet Take 1 tablet by mouth daily.     losartan (COZAAR) 100 MG tablet TAKE 1 TABLET BY MOUTH DAILY 90 tablet 1   pimecrolimus (ELIDEL) 1 % cream Apply topically 2 (two) times daily.     triamcinolone cream (KENALOG) 0.1 % SMARTSIG:1 Application Topical 2-3 Times Daily     valACYclovir (VALTREX) 1000 MG tablet Take 2 tablets (2,000 mg total) by mouth 2 (two) times daily. Use AS NEEDED for cold sore out break 4 tablet 2   Cholecalciferol (VITAMIN D3) 1.25 MG (50000 UT) CAPS TAKE 1 CAPSULE ONCE A WEEK FOR 8 WEEKS 8 capsule 0   EQUETRO 300 MG CP12 Take 1 capsule by mouth at bedtime.     VITAMIN E PO Take 1 capsule by mouth daily.     No current facility-administered medications on file prior to visit.    Social History   Tobacco Use   Smoking status: Never   Smokeless tobacco: Never  Substance Use Topics   Alcohol use: Yes   Drug use: No    Review of Systems  Constitutional:  Negative for chills and fever.  HENT:  Positive for congestion. Negative for ear pain, facial swelling, mouth sores, sinus pain, sore throat  and trouble swallowing.   Respiratory:  Negative for cough.   Cardiovascular:  Negative for chest pain and palpitations.  Gastrointestinal:  Negative for nausea and vomiting.  Musculoskeletal:  Negative for myalgias and neck pain.  Skin:  Negative for rash.  Psychiatric/Behavioral:  Negative for suicidal ideas. The patient is not nervous/anxious.      Objective:    BP 122/74 (BP Location: Left Arm, Patient Position: Sitting, Cuff Size: Large)   Pulse (!) 106   Temp 97.9 F (36.6 C) (Oral)   Ht 5\' 2"  (1.575 m)   Wt 154 lb 6.4 oz (70 kg)   SpO2 97%   BMI 28.24 kg/m  BP Readings from Last 3 Encounters:  06/08/21 122/74  10/26/19 135/87  09/27/19 120/82   Wt Readings from Last 3 Encounters:  06/08/21 154 lb 6.4 oz (70 kg)  10/10/20 147 lb (66.7 kg)  10/26/19 150 lb 4.8 oz (68.2 kg)     Physical Exam Vitals reviewed.  Constitutional:      Appearance: She is well-developed.  HENT:     Head: Normocephalic and atraumatic.     Right Ear: Hearing, tympanic membrane, ear canal and external ear normal. No decreased hearing noted. No drainage, swelling or tenderness. No middle ear effusion. No foreign body. Tympanic membrane is not erythematous or bulging.     Left Ear: Hearing, tympanic membrane, ear canal and external ear normal. No decreased hearing noted. No drainage, swelling or tenderness.  No middle ear effusion. No foreign body. Tympanic membrane is not erythematous or bulging.     Nose: Nose normal. No rhinorrhea.     Right Sinus: No maxillary sinus tenderness or frontal sinus tenderness.     Left Sinus: No maxillary sinus tenderness or frontal sinus tenderness.     Mouth/Throat:     Pharynx: Uvula midline. No oropharyngeal exudate or posterior oropharyngeal erythema.     Tonsils: No tonsillar abscesses.  Eyes:     Conjunctiva/sclera: Conjunctivae normal.  Cardiovascular:     Rate and Rhythm: Regular rhythm.     Pulses: Normal pulses.     Heart sounds: Normal heart sounds.  Pulmonary:     Effort: Pulmonary effort is normal.     Breath sounds: Normal breath sounds. No wheezing, rhonchi or rales.  Lymphadenopathy:     Head:     Right side of head: No submental, submandibular, tonsillar, preauricular, posterior auricular or occipital adenopathy.     Left side of head: No submental, submandibular, tonsillar, preauricular, posterior auricular or occipital adenopathy.     Cervical: No cervical adenopathy.  Skin:    General: Skin is warm and dry.  Neurological:     Mental Status: She is alert.  Psychiatric:        Speech: Speech normal.        Behavior: Behavior normal.        Thought Content: Thought content normal.       Assessment & Plan:   Problem List Items Addressed This Visit       Cardiovascular and Mediastinum   Essential hypertension - Primary     Controlled. Continue losartan 100mg , hctz 25mg .        Relevant Orders   Comprehensive metabolic panel (Completed)     Respiratory   Allergic rhinitis    Reassuring exam. No etiology of symptom of mouth open. Discussed thoroughly with patient today and discussed differentials of sinus congestion, TMJ. Even a benign habit. I advised an ENT consult for another opinion .  She declines as she is not particularly bothered by symptom at this time. She will let me know if becomes bothersome, more persistent, or new symptoms develop. Advised reasonable to trial azelastine if sinus congestion contributory from h/o seasonal allergies.          Other   Bipolar depression (HCC)    Chronic, stable. Continue following with psychiatry.        Nasal congestion   Relevant Medications   azelastine (ASTELIN) 0.1 % nasal spray   Vitamin D deficiency    Resolved. Will monitor periodically.          I have discontinued Lorene Klimas. Chaires's FERROUS SULFATE PO. I am also having her start on azelastine. Additionally, I am having her maintain her valACYclovir, pimecrolimus, Equetro, triamcinolone cream, Ivermectin, VITAMIN E PO, hydrochlorothiazide, Vitamin D3, losartan, ferrous sulfate, Vitamin D3, and lamoTRIgine.   Meds ordered this encounter  Medications   azelastine (ASTELIN) 0.1 % nasal spray    Sig: Place 1 spray into both nostrils 2 (two) times daily. Use in each nostril as directed    Dispense:  30 mL    Refill:  4    Order Specific Question:   Supervising Provider    Answer:   Crecencio Mc [[id number]]     Return precautions given.   Risks, benefits, and alternatives of the medications and treatment plan prescribed today were discussed, and patient expressed understanding.   Education regarding symptom management and diagnosis given to patient on AVS.  Continue to follow with Burnard Hawthorne, FNP for routine health maintenance.   Brooke Morales Crotty and I agreed with  plan.   Mable Paris, FNP

## 2021-06-10 ENCOUNTER — Other Ambulatory Visit: Payer: Self-pay | Admitting: Family

## 2021-06-10 DIAGNOSIS — I10 Essential (primary) hypertension: Secondary | ICD-10-CM

## 2021-06-10 MED ORDER — POTASSIUM CHLORIDE CRYS ER 10 MEQ PO TBCR: 10.0000 meq | EXTENDED_RELEASE_TABLET | ORAL | 1 refills | Status: DC

## 2021-06-10 NOTE — Assessment & Plan Note (Addendum)
Reassuring exam. No etiology of symptom of mouth open. Discussed thoroughly with patient today and discussed differentials of sinus congestion, TMJ. Even a benign habit. I advised an ENT consult for another opinion . She declines as she is not particularly bothered by symptom at this time. She will let me know if becomes bothersome, more persistent, or new symptoms develop. Advised reasonable to trial azelastine if sinus congestion contributory from h/o seasonal allergies.

## 2021-06-10 NOTE — Assessment & Plan Note (Signed)
Chronic, stable. Continue following with psychiatry.

## 2021-06-10 NOTE — Assessment & Plan Note (Signed)
Controlled. Continue losartan 100mg , hctz 25mg .

## 2021-06-10 NOTE — Assessment & Plan Note (Signed)
Resolved. Will monitor periodically.

## 2021-06-11 ENCOUNTER — Telehealth: Payer: Self-pay

## 2021-06-11 ENCOUNTER — Other Ambulatory Visit: Payer: Self-pay | Admitting: Obstetrics and Gynecology

## 2021-06-11 DIAGNOSIS — R928 Other abnormal and inconclusive findings on diagnostic imaging of breast: Secondary | ICD-10-CM

## 2021-06-11 NOTE — Telephone Encounter (Signed)
LMTCB for lab results.  

## 2021-06-13 ENCOUNTER — Other Ambulatory Visit: Payer: Self-pay

## 2021-06-13 DIAGNOSIS — E876 Hypokalemia: Secondary | ICD-10-CM

## 2021-06-22 ENCOUNTER — Other Ambulatory Visit (INDEPENDENT_AMBULATORY_CARE_PROVIDER_SITE_OTHER): Payer: BC Managed Care – PPO

## 2021-06-22 ENCOUNTER — Other Ambulatory Visit: Payer: Self-pay

## 2021-06-22 DIAGNOSIS — E876 Hypokalemia: Secondary | ICD-10-CM | POA: Diagnosis not present

## 2021-06-22 LAB — BASIC METABOLIC PANEL
BUN: 19 mg/dL (ref 6–23)
CO2: 28 mEq/L (ref 19–32)
Calcium: 9 mg/dL (ref 8.4–10.5)
Chloride: 103 mEq/L (ref 96–112)
Creatinine, Ser: 0.91 mg/dL (ref 0.40–1.20)
GFR: 68.7 mL/min (ref >60.00)
Glucose, Bld: 125 mg/dL — ABNORMAL HIGH (ref 70–99)
Potassium: 3.5 mEq/L (ref 3.5–5.1)
Sodium: 140 mEq/L (ref 135–145)

## 2021-06-25 ENCOUNTER — Other Ambulatory Visit: Payer: Self-pay

## 2021-06-25 DIAGNOSIS — R7301 Impaired fasting glucose: Secondary | ICD-10-CM

## 2021-06-28 ENCOUNTER — Other Ambulatory Visit: Payer: Self-pay

## 2021-06-28 ENCOUNTER — Ambulatory Visit
Admission: RE | Admit: 2021-06-28 | Discharge: 2021-06-28 | Disposition: A | Discharge status: Home / Self Care | Payer: BC Managed Care – PPO | Source: Ambulatory Visit | Attending: Obstetrics and Gynecology | Admitting: Obstetrics and Gynecology

## 2021-06-28 DIAGNOSIS — R928 Other abnormal and inconclusive findings on diagnostic imaging of breast: Secondary | ICD-10-CM

## 2021-07-06 ENCOUNTER — Other Ambulatory Visit (INDEPENDENT_AMBULATORY_CARE_PROVIDER_SITE_OTHER): Payer: BC Managed Care – PPO

## 2021-07-06 ENCOUNTER — Other Ambulatory Visit: Payer: Self-pay

## 2021-07-06 DIAGNOSIS — R7301 Impaired fasting glucose: Secondary | ICD-10-CM | POA: Diagnosis not present

## 2021-07-06 LAB — HEMOGLOBIN A1C: Hgb A1c MFr Bld: 5.5 % (ref 4.6–6.5)

## 2021-09-05 ENCOUNTER — Other Ambulatory Visit: Payer: Self-pay | Admitting: Family

## 2021-09-05 DIAGNOSIS — R0981 Nasal congestion: Secondary | ICD-10-CM

## 2021-09-28 ENCOUNTER — Other Ambulatory Visit: Payer: Self-pay | Admitting: Family

## 2021-09-28 DIAGNOSIS — R0981 Nasal congestion: Secondary | ICD-10-CM

## 2021-10-30 ENCOUNTER — Other Ambulatory Visit: Payer: Self-pay | Admitting: Family

## 2021-12-01 ENCOUNTER — Other Ambulatory Visit: Payer: Self-pay | Admitting: Family

## 2022-04-25 ENCOUNTER — Other Ambulatory Visit: Payer: Self-pay | Admitting: Family

## 2022-05-28 ENCOUNTER — Other Ambulatory Visit: Payer: Self-pay | Admitting: Family

## 2022-05-28 ENCOUNTER — Telehealth: Payer: Self-pay | Admitting: Family

## 2022-05-28 DIAGNOSIS — I10 Essential (primary) hypertension: Secondary | ICD-10-CM

## 2022-05-29 ENCOUNTER — Other Ambulatory Visit: Payer: Self-pay

## 2022-05-29 DIAGNOSIS — E559 Vitamin D deficiency, unspecified: Secondary | ICD-10-CM

## 2022-05-29 DIAGNOSIS — R5383 Other fatigue: Secondary | ICD-10-CM

## 2022-05-29 DIAGNOSIS — I10 Essential (primary) hypertension: Secondary | ICD-10-CM

## 2022-05-29 NOTE — Telephone Encounter (Signed)
Spoke to patient and got her scheduled for fasting labs for 06/13/22

## 2022-05-29 NOTE — Progress Notes (Signed)
ORDERS ONLY

## 2022-05-29 NOTE — Telephone Encounter (Signed)
Please order customary fasting cpe labs ahead of visit and call pt to sch

## 2022-06-01 ENCOUNTER — Other Ambulatory Visit: Payer: Self-pay | Admitting: Family

## 2022-06-13 ENCOUNTER — Other Ambulatory Visit (INDEPENDENT_AMBULATORY_CARE_PROVIDER_SITE_OTHER): Payer: BC Managed Care – PPO

## 2022-06-13 DIAGNOSIS — E559 Vitamin D deficiency, unspecified: Secondary | ICD-10-CM | POA: Diagnosis not present

## 2022-06-13 DIAGNOSIS — R5383 Other fatigue: Secondary | ICD-10-CM

## 2022-06-13 DIAGNOSIS — I10 Essential (primary) hypertension: Secondary | ICD-10-CM

## 2022-06-14 LAB — CBC WITH DIFFERENTIAL/PLATELET
Basophils Absolute: 0 10*3/uL (ref 0.0–0.1)
Basophils Relative: 0.5 % (ref 0.0–3.0)
Eosinophils Absolute: 0.1 10*3/uL (ref 0.0–0.7)
Eosinophils Relative: 1.9 % (ref 0.0–5.0)
HCT: 37.7 % (ref 36.0–46.0)
Hemoglobin: 12.3 g/dL (ref 12.0–15.0)
Lymphocytes Relative: 31.2 % (ref 12.0–46.0)
Lymphs Abs: 2.4 10*3/uL (ref 0.7–4.0)
MCHC: 32.7 g/dL (ref 30.0–36.0)
MCV: 92.4 fl (ref 78.0–100.0)
Monocytes Absolute: 0.9 10*3/uL (ref 0.1–1.0)
Monocytes Relative: 11.7 % (ref 3.0–12.0)
Neutro Abs: 4.2 10*3/uL (ref 1.4–7.7)
Neutrophils Relative %: 54.7 % (ref 43.0–77.0)
Platelets: 363 10*3/uL (ref 150.0–400.0)
RBC: 4.07 Mil/uL (ref 3.87–5.11)
RDW: 14.1 % (ref 11.5–15.5)
WBC: 7.6 10*3/uL (ref 4.0–10.5)

## 2022-06-14 LAB — COMPREHENSIVE METABOLIC PANEL
ALT: 19 U/L (ref 0–35)
AST: 15 U/L (ref 0–37)
Albumin: 4.4 g/dL (ref 3.5–5.2)
Alkaline Phosphatase: 72 U/L (ref 39–117)
BUN: 22 mg/dL (ref 6–23)
CO2: 27 mEq/L (ref 19–32)
Calcium: 9.2 mg/dL (ref 8.4–10.5)
Chloride: 103 mEq/L (ref 96–112)
Creatinine, Ser: 0.95 mg/dL (ref 0.40–1.20)
GFR: 64.8 mL/min (ref >60.00)
Glucose, Bld: 84 mg/dL (ref 70–99)
Potassium: 3.7 mEq/L (ref 3.5–5.1)
Sodium: 139 mEq/L (ref 135–145)
Total Bilirubin: 0.4 mg/dL (ref 0.2–1.2)
Total Protein: 6.7 g/dL (ref 6.0–8.3)

## 2022-06-14 LAB — HEMOGLOBIN A1C: Hgb A1c MFr Bld: 5.3 % (ref 4.6–6.5)

## 2022-06-14 LAB — TSH: TSH: 2.25 u[IU]/mL (ref 0.35–5.50)

## 2022-06-14 LAB — VITAMIN D 25 HYDROXY (VIT D DEFICIENCY, FRACTURES): VITD: 26.01 ng/mL — ABNORMAL LOW (ref 30.00–100.00)

## 2022-06-28 ENCOUNTER — Telehealth: Payer: Self-pay | Admitting: Family

## 2022-06-28 NOTE — Telephone Encounter (Signed)
Called and spoke to patient in regards to  her message. Will speak to Joycelyn Schmid on Mon to see if she needs labs because she just had labs in July?? May need to check Potassium

## 2022-06-28 NOTE — Telephone Encounter (Signed)
Pt called in requesting labs before her next appt... No lab orders in system... Pt requesting callback

## 2022-07-03 ENCOUNTER — Ambulatory Visit: Payer: BC Managed Care – PPO | Admitting: Family

## 2022-07-24 ENCOUNTER — Other Ambulatory Visit: Payer: Self-pay | Admitting: Family

## 2022-08-20 ENCOUNTER — Ambulatory Visit: Payer: BC Managed Care – PPO | Admitting: Behavioral Health

## 2022-08-27 ENCOUNTER — Other Ambulatory Visit: Payer: Self-pay | Admitting: Family

## 2022-08-27 DIAGNOSIS — I10 Essential (primary) hypertension: Secondary | ICD-10-CM

## 2022-09-02 ENCOUNTER — Encounter: Payer: Self-pay | Admitting: Behavioral Health

## 2022-09-02 ENCOUNTER — Ambulatory Visit: Payer: BC Managed Care – PPO | Admitting: Behavioral Health

## 2022-09-02 VITALS — BP 123/77 | HR 77 | Ht 62.0 in | Wt 153.0 lb

## 2022-09-02 DIAGNOSIS — F39 Unspecified mood [affective] disorder: Secondary | ICD-10-CM | POA: Diagnosis not present

## 2022-09-02 DIAGNOSIS — F331 Major depressive disorder, recurrent, moderate: Secondary | ICD-10-CM | POA: Diagnosis not present

## 2022-09-02 DIAGNOSIS — F411 Generalized anxiety disorder: Secondary | ICD-10-CM | POA: Diagnosis not present

## 2022-09-02 MED ORDER — LAMOTRIGINE 150 MG PO TABS: 300.0000 mg | ORAL_TABLET | Freq: Every day | ORAL | 1 refills | Status: DC

## 2022-09-02 NOTE — Progress Notes (Signed)
[facility] MD/PA/NP Initial Note  09/02/2022 11:59 AM Lysle Morales Ran  MRN:  [id number]  Chief Complaint:  Chief Complaint   Manic Behavior; Follow-up; Depression; Anxiety; Establish Care; Medication Refill; Patient Education     HPI:  "Brooke Harmon", 61 year old female presents to this office for initial visit to establish care.  She says that she is here to find a new provider who can manage her medications and maybe try something different.  Says she was previously receiving care from Dr. Beryle Lathe, DNP.  Says that she just wanted to get a fresh set of eyes, but she is currently okay with the medication she is on.  Says she was diagnosed with bipolar disorder approximately 8 years ago.  Says that she does experience frequent fluctuation with her moods but they are not as severe as most people.  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  She does endorse periods of irritability, racing thoughts and lack of concentration reduced to sleep, and increased activity.  Says that a couple of years ago she just really wanted to come off many of her medications to see how she would respond.  Says that she did have 1 previous suicide attempt in 2014 where she cut her wrist.  She did not seek medical care or go to the ER.  Said that it was after initiating Lexapro.  She went back to work 2 days later.  She is only taking lamotrigine at this time for her mood swings.  She does report moderate depression, but does not want to take any antidepressants at this time.  Says that she would like to follow-up in about 2 months to reassess, and then determine if she would like to try a new medication.  Her PHQ-9 was score of 10.  Numerically she reports depression at 2/10, and anxiety at 2/10.  She is sleeping approximately 6 hours per night. She denies any current mania, no history of psychosis, no auditory or visual hallucinations.  Prior psychotropic medication  trials: Carbamazepine Depakote Artane Lexapro=SI Latuda Zoloft Serzone Lamictal Xanax      Visit Diagnosis:    ICD-10-CM   1. Major depressive disorder, recurrent episode, moderate (HCC)  F33.1     2. Generalized anxiety disorder  F41.1     3. Unspecified mood (affective) disorder (HCC)  F39       Past Psychiatric History: Bipolar unspecified, MDD, Anxiety  Past Medical History:  Past Medical History:  Diagnosis Date   Chicken pox    Depression    Hypertension    Migraines    Psoriasis     Past Surgical History:  Procedure Laterality Date   CESAREAN SECTION      Family Psychiatric History: see chart  Family History:  Family History  Problem Relation Age of Onset   Dementia Mother    Anxiety disorder Mother    Depression Mother    Hypertension Mother    Mental illness Mother    Cancer Father        prostate   Hypertension Father    Mental illness Father    Diabetes Father    Depression Maternal Grandmother    Anxiety disorder Maternal Grandmother     Social History:  Social History   Socioeconomic History   Marital status: Married    Spouse name: Josph Macho   Number of children: 2   Years of education: 18   Highest education level:  Master's degree (e.g., MA, MS, MEng, MEd, MSW, MBA)  Occupational History   Occupation: Librarian [employer]  Tobacco Use   Smoking status: Never   Smokeless tobacco: Never  Vaping Use   Vaping Use: Never used  Substance and Sexual Activity   Alcohol use: Yes    Comment: light 4 oz  several nights per week   Drug use: No   Sexual activity: Not Currently  Other Topics Concern   Not on file  Social History Narrative   Lives at home with husband and daughter in [place]. Likes to read, walk, cooking and baking in free time.    Social Determinants of Health   Financial Resource Strain: Not on file  Food Insecurity: Not on file  Transportation Needs: Not on file  Physical Activity: Not on file  Stress: Not on  file  Social Connections: Not on file    Allergies:  Allergies  Allergen Reactions   Lexapro  [Escitalopram Oxalate] Other (See Comments)    Pt reports she had sleeplessness, delusional thinking and self cutting   Lisinopril     cough    Metabolic Disorder Labs: Lab Results  Component Value Date   HGBA1C 5.3 06/13/2022   No results found for: "PROLACTIN" Lab Results  Component Value Date   CHOL 202 (H) 11/08/2020   TRIG 106.0 11/08/2020   HDL 53.10 11/08/2020   CHOLHDL 4 11/08/2020   VLDL 21.2 11/08/2020   LDLCALC 128 (H) 11/08/2020   LDLCALC 114 (H) 09/27/2019   Lab Results  Component Value Date   TSH 2.25 06/13/2022   TSH 2.55 11/08/2020    Therapeutic Level Labs: No results found for: "LITHIUM" No results found for: "VALPROATE" No results found for: "CBMZ"  Current Medications: Current Outpatient Medications  Medication Sig Dispense Refill   Azelastine HCl 137 MCG/SPRAY SOLN PLACE 1 SPRAY INTO BOTH NOSTRILS 2 (TWO) TIMES DAILY. USE IN EACH NOSTRIL AS DIRECTED 30 mL 1   Cholecalciferol (VITAMIN D3) 20 MCG (800 UNIT) TABS Take 1 capsule by mouth daily.     ferrous sulfate 325 (65 FE) MG tablet Take 325 mg by mouth daily with breakfast.     hydrochlorothiazide (HYDRODIURIL) 25 MG tablet TAKE 1 TABLET BY MOUTH EVERY DAY 90 tablet 0   Ivermectin 1 % CREA Apply 1 application topically daily as needed.     KLOR-CON M10 10 MEQ tablet TAKE 1 TABLET (10 MEQ TOTAL) BY MOUTH EVERY OTHER DAY. 45 tablet 0   lamoTRIgine (LAMICTAL) 200 MG tablet Take 1 tablet by mouth daily.     losartan (COZAAR) 100 MG tablet TAKE 1 TABLET BY MOUTH EVERY DAY 90 tablet 1   pimecrolimus (ELIDEL) 1 % cream Apply topically 2 (two) times daily.     triamcinolone cream (KENALOG) 0.1 % SMARTSIG:1 Application Topical 2-3 Times Daily     valACYclovir (VALTREX) 1000 MG tablet Take 2 tablets (2,000 mg total) by mouth 2 (two) times daily. Use AS NEEDED for cold sore out break 4 tablet 2   VITAMIN E PO  Take 1 capsule by mouth daily.     No current facility-administered medications for this visit.    Medication Side Effects: none  Orders placed this visit:  No orders of the defined types were placed in this encounter.   Psychiatric Specialty Exam:  Review of Systems  Blood pressure 123/77, pulse 77, height '5\' 2"'$  (1.575 m), weight 153 lb (69.4 kg).Body mass index is 27.98 kg/m.  General Appearance: Casual, Neat, and  Well Groomed  Eye Contact:  Good  Speech:  Clear and Coherent  Volume:  Normal  Mood:  Anxious, Depressed, and Dysphoric  Affect:  Depressed, Flat, Restricted, and Anxious  Thought Process:  Coherent  Orientation:  Full (Time, Place, and Person)  Thought Content: Logical   Suicidal Thoughts:  No  Homicidal Thoughts:  No  Memory:  WNL  Judgement:  Good  Insight:  Good  Psychomotor Activity:  Normal  Concentration:  Concentration: Good  Recall:  Good  Fund of Knowledge: Good  Language: Good  Assets:  Desire for Improvement  ADL's:  Intact  Cognition: WNL  Prognosis:  Good   Screenings:  PHQ2-9    Flowsheet Row Office Visit from 09/02/2022 in [facility] Office Visit from 09/23/2018 in [facility]  PHQ-2 Total Score 3 0  PHQ-9 Total Score 10 0       Receiving Psychotherapy: No   Treatment Plan/Recommendations:   Greater than 50% of  60 min face to face time with patient was spent on counseling and coordination of care. We discussed her long history of anxiety and depression as well as previous bipolar diagnosis.  Discussed her previous plan of care, prior medications, and treatment.  Discussed other medication alternatives and her goals for treatment here.  For now, she is decided to not change her medications but we will follow-up in 8 weeks to consider.  She is requesting refills. We agreed to: We will continue Lamictal 300 mg daily We will report side effects or worsening symptoms promptly We will follow-up in 8  weeks for reassessment Provided emergency contact information Monitor for any sign of rash. Please taking Lamictal and contact office immediately rash develops. Recommend seeking urgent medical attention if rash is severe and/or spreading quickly.  Reviewed PDMP    Elwanda Brooklyn, NP

## 2022-10-14 ENCOUNTER — Telehealth: Payer: Self-pay | Admitting: Family

## 2022-10-14 DIAGNOSIS — E782 Mixed hyperlipidemia: Secondary | ICD-10-CM

## 2022-10-14 DIAGNOSIS — R5383 Other fatigue: Secondary | ICD-10-CM

## 2022-10-14 NOTE — Telephone Encounter (Signed)
Patient called and wanted to know if she needs to do labs before her appointment on 11/28.

## 2022-10-18 NOTE — Telephone Encounter (Signed)
She will need fasting lipid panel and vit d  She may do either before or during appts  Please sch based on her preference

## 2022-10-18 NOTE — Addendum Note (Signed)
Addended by: [place], Desi Carby on: 10/18/2022 02:33 PM   Modules accepted: Orders

## 2022-10-21 ENCOUNTER — Other Ambulatory Visit: Payer: Self-pay | Admitting: Family

## 2022-10-29 ENCOUNTER — Encounter: Payer: Self-pay | Admitting: Family

## 2022-10-29 ENCOUNTER — Ambulatory Visit: Payer: BC Managed Care – PPO | Admitting: Family

## 2022-10-29 ENCOUNTER — Ambulatory Visit (INDEPENDENT_AMBULATORY_CARE_PROVIDER_SITE_OTHER): Payer: BC Managed Care – PPO

## 2022-10-29 VITALS — BP 126/76 | HR 99 | Temp 97.9°F | Ht 62.0 in | Wt 154.6 lb

## 2022-10-29 DIAGNOSIS — E559 Vitamin D deficiency, unspecified: Secondary | ICD-10-CM | POA: Diagnosis not present

## 2022-10-29 DIAGNOSIS — R053 Chronic cough: Secondary | ICD-10-CM

## 2022-10-29 DIAGNOSIS — I10 Essential (primary) hypertension: Secondary | ICD-10-CM | POA: Diagnosis not present

## 2022-10-29 DIAGNOSIS — E611 Iron deficiency: Secondary | ICD-10-CM

## 2022-10-29 DIAGNOSIS — F319 Bipolar disorder, unspecified: Secondary | ICD-10-CM

## 2022-10-29 LAB — IBC + FERRITIN
Ferritin: 10.3 ng/mL (ref 10.0–291.0)
Iron: 155 ug/dL — ABNORMAL HIGH (ref 42–145)
Saturation Ratios: 37.3 % (ref 20.0–50.0)
TIBC: 415.8 ug/dL (ref 250.0–450.0)
Transferrin: 297 mg/dL (ref 212.0–360.0)

## 2022-10-29 LAB — VITAMIN D 25 HYDROXY (VIT D DEFICIENCY, FRACTURES): VITD: 22.47 ng/mL — ABNORMAL LOW (ref 30.00–100.00)

## 2022-10-29 MED ORDER — POTASSIUM CHLORIDE CRYS ER 10 MEQ PO TBCR: EXTENDED_RELEASE_TABLET | ORAL | 3 refills | Status: DC

## 2022-10-29 MED ORDER — HYDROCHLOROTHIAZIDE 25 MG PO TABS: 25.0000 mg | ORAL_TABLET | Freq: Every day | ORAL | 3 refills | Status: DC

## 2022-10-29 MED ORDER — LOSARTAN POTASSIUM 100 MG PO TABS: 100.0000 mg | ORAL_TABLET | Freq: Every day | ORAL | 3 refills | Status: DC

## 2022-10-29 NOTE — Assessment & Plan Note (Signed)
Chronic cough for years.  History of cough with lisinopril.  If no resolution with Mucinex, Pepcid AC, we could trial stop losartan.  We discuss sequela from COVID and may consider trial Breztri or formal evaluation with pulmonology. Pending CXR.

## 2022-10-29 NOTE — Patient Instructions (Addendum)
For cough, I would start with Mucinex DM.  Mucinex is an expectorant and DM is dextromethorphan ,a cough suppressant.  I would consider a trial stop the losartan since you had a previous cough with similar medication, lisinopril. More rarely, however you can acquire a cough with losartan.  For burping,  reasonable to try over-the-counter Pepcid AC taken once in the evening.  Be consistent with this to see if cough and/or burping were to improve over the next couple of weeks.  Please make follow-up with me if cough does not resolve

## 2022-10-29 NOTE — Progress Notes (Signed)
Subjective:    Patient ID: Brooke Harmon, female    DOB: 14-Apr-1961, 61 y.o.   MRN: [id number]  CC: [name] is a 61 y.o. female who presents today for follow up.   HPI: Compliant with vitamin D 1000 IU QD  Complains of chronic , episodic productive cough for the past couple of years. Cough is throughout the day. More recently she feels thick white to clear congestion has worsened in the past year.  She had COVID approximately a year ago . congestion worse in morning and in the evening. Endorses nasal congestion. Not affected by cold weather.  No fever,  PND, sob, wheezing, sinus pain, ear pain, leg swelling.   Flonase was not effective. Claritin wasn't helpful .  No epigastric burning. She will start burping after eating.  No trouble swallowing, vomiting, choking ,pain with swallowing.   No h/o smoking, lung disease.   No formal exercise.   She donates blood several times a year and has been told iron stores were low.  She takes ferrous sulfate 325 daily for this reason    HTN- compliant with hctz '25mg'$  QD, losartan '100mg'$  qd.  She takes potassium chloride 10 mEq QOD  She continues to follow with psychiatry,plans to establish Dr Myrtha Mantis at [facility], who prescribes Lamictal.  Denies rash  HISTORY:  Past Medical History:  Diagnosis Date   Chicken pox    Depression    Hypertension    Migraines    Psoriasis    Past Surgical History:  Procedure Laterality Date   CESAREAN SECTION     Family History  Problem Relation Age of Onset   Dementia Mother    Anxiety disorder Mother    Depression Mother    Hypertension Mother    Mental illness Mother    Cancer Father        prostate   Hypertension Father    Mental illness Father    Diabetes Father    Depression Maternal Grandmother    Anxiety disorder Maternal Grandmother     Allergies: Lexapro  [escitalopram oxalate] and Lisinopril Current Outpatient Medications on File Prior to Visit  Medication  Sig Dispense Refill   Azelastine HCl 137 MCG/SPRAY SOLN PLACE 1 SPRAY INTO BOTH NOSTRILS 2 (TWO) TIMES DAILY. USE IN EACH NOSTRIL AS DIRECTED 30 mL 1   Cholecalciferol (VITAMIN D3) 20 MCG (800 UNIT) TABS Take 1 capsule by mouth daily.     ferrous sulfate 325 (65 FE) MG tablet Take 325 mg by mouth daily with breakfast.     Ivermectin 1 % CREA Apply 1 application topically daily as needed.     lamoTRIgine (LAMICTAL) 150 MG tablet Take 2 tablets (300 mg total) by mouth daily. 60 tablet 1   pimecrolimus (ELIDEL) 1 % cream Apply topically 2 (two) times daily.     triamcinolone cream (KENALOG) 0.1 % SMARTSIG:1 Application Topical 2-3 Times Daily     valACYclovir (VALTREX) 1000 MG tablet Take 2 tablets (2,000 mg total) by mouth 2 (two) times daily. Use AS NEEDED for cold sore out break 4 tablet 2   No current facility-administered medications on file prior to visit.    Social History   Tobacco Use   Smoking status: Never   Smokeless tobacco: Never  Vaping Use   Vaping Use: Never used  Substance Use Topics   Alcohol use: Yes    Comment: light 4 oz  several nights per week   Drug use: No    Review  of Systems  Constitutional:  Negative for chills and fever.  Respiratory:  Negative for cough.   Cardiovascular:  Negative for chest pain and palpitations.  Gastrointestinal:  Negative for nausea and vomiting.      Objective:    BP 126/76 (BP Location: Left Arm, Patient Position: Sitting, Cuff Size: Normal)   Pulse 99   Temp 97.9 F (36.6 C) (Oral)   Ht '5\' 2"'$  (1.575 m)   Wt 154 lb 9.6 oz (70.1 kg)   SpO2 97%   BMI 28.28 kg/m  BP Readings from Last 3 Encounters:  10/29/22 126/76  09/02/22 123/77  06/08/21 122/74   Wt Readings from Last 3 Encounters:  10/29/22 154 lb 9.6 oz (70.1 kg)  09/02/22 153 lb (69.4 kg)  06/08/21 154 lb 6.4 oz (70 kg)    Physical Exam Vitals reviewed.  Constitutional:      Appearance: She is well-developed.  HENT:     Head: Normocephalic and  atraumatic.     Right Ear: Hearing, tympanic membrane, ear canal and external ear normal. No decreased hearing noted. No drainage, swelling or tenderness. No middle ear effusion. No foreign body. Tympanic membrane is not erythematous or bulging.     Left Ear: Hearing, tympanic membrane, ear canal and external ear normal. No decreased hearing noted. No drainage, swelling or tenderness.  No middle ear effusion. No foreign body. Tympanic membrane is not erythematous or bulging.     Nose: Nose normal. No rhinorrhea.     Right Sinus: No maxillary sinus tenderness or frontal sinus tenderness.     Left Sinus: No maxillary sinus tenderness or frontal sinus tenderness.     Mouth/Throat:     Pharynx: Uvula midline. No oropharyngeal exudate or posterior oropharyngeal erythema.     Tonsils: No tonsillar abscesses.  Eyes:     Conjunctiva/sclera: Conjunctivae normal.  Cardiovascular:     Rate and Rhythm: Regular rhythm.     Pulses: Normal pulses.     Heart sounds: Normal heart sounds.  Pulmonary:     Effort: Pulmonary effort is normal.     Breath sounds: Normal breath sounds. No wheezing, rhonchi or rales.  Lymphadenopathy:     Head:     Right side of head: No submental, submandibular, tonsillar, preauricular, posterior auricular or occipital adenopathy.     Left side of head: No submental, submandibular, tonsillar, preauricular, posterior auricular or occipital adenopathy.     Cervical: No cervical adenopathy.  Skin:    General: Skin is warm and dry.  Neurological:     Mental Status: She is alert.  Psychiatric:        Speech: Speech normal.        Behavior: Behavior normal.        Thought Content: Thought content normal.        Assessment & Plan:   Problem List Items Addressed This Visit       Cardiovascular and Mediastinum   Essential hypertension    Chronic, stable.  Continue hctz '25mg'$  QD, losartan '100mg'$  qd,potassium chloride 10 mEq QOD      Relevant Medications   potassium  chloride (KLOR-CON M10) 10 MEQ tablet   hydrochlorothiazide (HYDRODIURIL) 25 MG tablet   losartan (COZAAR) 100 MG tablet     Other   Bipolar depression ([place])    Plans to establish care with Dr Myrtha Mantis at [facility]. Will follow      Chronic cough - Primary    Chronic cough for years.  History of cough with  lisinopril.  If no resolution with Mucinex, Pepcid AC, we could trial stop losartan.  We discuss sequela from COVID and may consider trial Breztri or formal evaluation with pulmonology. Pending CXR.        Relevant Orders   DG Chest 2 View   Vitamin D deficiency   Relevant Orders   VITAMIN D 25 Hydroxy (Vit-D Deficiency, Fractures)   Other Visit Diagnoses     Low iron       Relevant Orders   IBC + Ferritin        I have changed Shirley Friar. Hiller's [place]-Con M10 to potassium chloride. I have also changed her hydrochlorothiazide and losartan. I am also having her maintain her valACYclovir, pimecrolimus, triamcinolone cream, Ivermectin, ferrous sulfate, Vitamin D3, Azelastine HCl, and lamoTRIgine.   Meds ordered this encounter  Medications   potassium chloride (KLOR-CON M10) 10 MEQ tablet    Sig: TAKE 1 TABLET (10 MEQ TOTAL) BY MOUTH EVERY OTHER DAY.    Dispense:  45 tablet    Refill:  3    Order Specific Question:   Supervising Provider    Answer:   Deborra Medina L [[id number]]   hydrochlorothiazide (HYDRODIURIL) 25 MG tablet    Sig: Take 1 tablet (25 mg total) by mouth daily.    Dispense:  90 tablet    Refill:  3    Order Specific Question:   Supervising Provider    Answer:   Deborra Medina L [[id number]]   losartan (COZAAR) 100 MG tablet    Sig: Take 1 tablet (100 mg total) by mouth daily.    Dispense:  90 tablet    Refill:  3    Order Specific Question:   Supervising Provider    Answer:   Crecencio Mc [[id number]]    Return precautions given.   Risks, benefits, and alternatives of the medications and treatment plan prescribed today were discussed, and patient expressed  understanding.   Education regarding symptom management and diagnosis given to patient on AVS.  Continue to follow with Burnard Hawthorne, FNP for routine health maintenance.   Brooke Morales Mcnair and I agreed with plan.   Mable Paris, FNP

## 2022-10-29 NOTE — Assessment & Plan Note (Signed)
Plans to establish care with Dr Myrtha Mantis at [facility]. Will follow

## 2022-10-29 NOTE — Assessment & Plan Note (Signed)
Chronic, stable.  Continue hctz '25mg'$  QD, losartan '100mg'$  qd,potassium chloride 10 mEq QOD

## 2022-10-30 ENCOUNTER — Other Ambulatory Visit: Payer: Self-pay | Admitting: Family

## 2022-10-30 DIAGNOSIS — E559 Vitamin D deficiency, unspecified: Secondary | ICD-10-CM

## 2022-10-30 MED ORDER — CHOLECALCIFEROL 1.25 MG (50000 UT) PO TABS: ORAL_TABLET | ORAL | 0 refills | Status: DC

## 2022-11-04 ENCOUNTER — Ambulatory Visit: Payer: BC Managed Care – PPO | Admitting: Adult Health

## 2022-11-04 ENCOUNTER — Encounter: Payer: Self-pay | Admitting: Adult Health

## 2022-11-04 ENCOUNTER — Ambulatory Visit: Payer: BC Managed Care – PPO | Admitting: Behavioral Health

## 2022-11-04 DIAGNOSIS — F411 Generalized anxiety disorder: Secondary | ICD-10-CM

## 2022-11-04 DIAGNOSIS — F39 Unspecified mood [affective] disorder: Secondary | ICD-10-CM

## 2022-11-04 DIAGNOSIS — F331 Major depressive disorder, recurrent, moderate: Secondary | ICD-10-CM

## 2022-11-04 MED ORDER — LAMOTRIGINE 150 MG PO TABS: 300.0000 mg | ORAL_TABLET | Freq: Every day | ORAL | 1 refills | Status: DC

## 2022-11-04 NOTE — Progress Notes (Signed)
[facility] MD/PA/NP Initial Note  11/04/2022 10:50 AM Chloe Flis Saldarriaga  MRN:  [id number]  Chief Complaint:   HPI:   Describes mood today as "ok". Pleasant. Denies tearfulness.  Mood symptoms - denies depression. Reports some anxiety and irritability. Stating "I feel anxious all the time". Reports worry, rumination, and obsessive thoughts. Mood is consistent. Feels like the Lamictal works well to manage mood. Stable interest and motivation. Taking medications as prescribed.  Energy levels stable. Active, does not have a regular exercise routine. Walking some days. Enjoys some usual interests and activities. Married. Lives with husband and daughter. Spending time with family. Appetite adequate. Weight stable. Sleeps well most nights. Averages 7 hours. Focus and concentration difficulties on the work setting. Completing tasks. Managing aspects of household. Works full-time as a Physiological scientist. Denies SI or HI.  Denies AH or VH. Denies self harm.  Denies substance use.  Previous medication trials:  Carbamazepine Depakote Artane Lexapro - SI Latuda Zoloft Serzone Lamictal Xanax   Visit Diagnosis:    ICD-10-CM   1. Unspecified mood (affective) disorder (HCC)  F39 lamoTRIgine (LAMICTAL) 150 MG tablet    2. Major depressive disorder, recurrent episode, moderate (HCC)  F33.1 lamoTRIgine (LAMICTAL) 150 MG tablet    3. Generalized anxiety disorder  F41.1       Past Psychiatric History: Denies psychiatric hospitalization.   Past Medical History:  Past Medical History:  Diagnosis Date   Chicken pox    Depression    Hypertension    Migraines    Psoriasis     Past Surgical History:  Procedure Laterality Date   CESAREAN SECTION      Family Psychiatric History: Family history of mental illness.   Family History:  Family History  Problem Relation Age of Onset   Dementia Mother    Anxiety disorder Mother    Depression Mother    Hypertension Mother    Mental  illness Mother    Cancer Father        prostate   Hypertension Father    Mental illness Father    Diabetes Father    Depression Maternal Grandmother    Anxiety disorder Maternal Grandmother     Social History:  Social History   Socioeconomic History   Marital status: Married    Spouse name: Josph Macho   Number of children: 2   Years of education: 18   Highest education level: Conservator, museum/gallery (e.g., MA, MS, MEng, MEd, MSW, MBA)  Occupational History   Occupation: Librarian [employer]  Tobacco Use   Smoking status: Never   Smokeless tobacco: Never  Vaping Use   Vaping Use: Never used  Substance and Sexual Activity   Alcohol use: Yes    Comment: light 4 oz  several nights per week   Drug use: No   Sexual activity: Not Currently  Other Topics Concern   Not on file  Social History Narrative   Lives at home with husband and daughter in [place]. Likes to read, walk, cooking and baking in free time.    Social Determinants of Health   Financial Resource Strain: Not on file  Food Insecurity: Not on file  Transportation Needs: Not on file  Physical Activity: Not on file  Stress: Not on file  Social Connections: Not on file    Allergies:  Allergies  Allergen Reactions   Lexapro  [Escitalopram Oxalate] Other (See Comments)    Pt reports she had sleeplessness, delusional thinking and self cutting   Lisinopril  cough    Metabolic Disorder Labs: Lab Results  Component Value Date   HGBA1C 5.3 06/13/2022   No results found for: "PROLACTIN" Lab Results  Component Value Date   CHOL 202 (H) 11/08/2020   TRIG 106.0 11/08/2020   HDL 53.10 11/08/2020   CHOLHDL 4 11/08/2020   VLDL 21.2 11/08/2020   LDLCALC 128 (H) 11/08/2020   LDLCALC 114 (H) 09/27/2019   Lab Results  Component Value Date   TSH 2.25 06/13/2022   TSH 2.55 11/08/2020    Therapeutic Level Labs: No results found for: "LITHIUM" No results found for: "VALPROATE" No results found for: "CBMZ"  Current  Medications: Current Outpatient Medications  Medication Sig Dispense Refill   Azelastine HCl 137 MCG/SPRAY SOLN PLACE 1 SPRAY INTO BOTH NOSTRILS 2 (TWO) TIMES DAILY. USE IN EACH NOSTRIL AS DIRECTED 30 mL 1   Cholecalciferol (VITAMIN D3) 20 MCG (800 UNIT) TABS Take 1 capsule by mouth daily.     Cholecalciferol 1.25 MG (50000 UT) TABS 50,000 units PO qwk for 8 weeks. 8 tablet 0   ferrous sulfate 325 (65 FE) MG tablet Take 325 mg by mouth daily with breakfast.     hydrochlorothiazide (HYDRODIURIL) 25 MG tablet Take 1 tablet (25 mg total) by mouth daily. 90 tablet 3   Ivermectin 1 % CREA Apply 1 application topically daily as needed.     lamoTRIgine (LAMICTAL) 150 MG tablet Take 2 tablets (300 mg total) by mouth daily. 180 tablet 1   losartan (COZAAR) 100 MG tablet Take 1 tablet (100 mg total) by mouth daily. 90 tablet 3   pimecrolimus (ELIDEL) 1 % cream Apply topically 2 (two) times daily.     potassium chloride (KLOR-CON M10) 10 MEQ tablet TAKE 1 TABLET (10 MEQ TOTAL) BY MOUTH EVERY OTHER DAY. 45 tablet 3   triamcinolone cream (KENALOG) 0.1 % SMARTSIG:1 Application Topical 2-3 Times Daily     valACYclovir (VALTREX) 1000 MG tablet Take 2 tablets (2,000 mg total) by mouth 2 (two) times daily. Use AS NEEDED for cold sore out break 4 tablet 2   No current facility-administered medications for this visit.    Medication Side Effects: none  Orders placed this visit:  No orders of the defined types were placed in this encounter.   Psychiatric Specialty Exam:  Review of Systems  Musculoskeletal:  Negative for gait problem.  Neurological:  Negative for tremors.  Psychiatric/Behavioral:         Please refer to HPI    There were no vitals taken for this visit.There is no height or weight on file to calculate BMI.  General Appearance: Casual and Neat  Eye Contact:  Good  Speech:  Clear and Coherent and Normal Rate  Volume:  Normal  Mood:  Euthymic  Affect:  Appropriate and Congruent  Thought  Process:  Coherent and Descriptions of Associations: Intact  Orientation:  Full (Time, Place, and Person)  Thought Content: Logical   Suicidal Thoughts:  No  Homicidal Thoughts:  No  Memory:  WNL  Judgement:  Good  Insight:  Good  Psychomotor Activity:  Normal  Concentration:  Concentration: Good and Attention Span: Good  Recall:  Good  Fund of Knowledge: Good  Language: Good  Assets:  Communication Skills Desire for Improvement Financial Resources/Insurance Housing Intimacy Leisure Time Physical Health Resilience Social Support Talents/Skills Transportation Vocational/Educational  ADL's:  Intact  Cognition: WNL  Prognosis:  Good   Screenings:  PHQ2-9    [facility] Office Visit from 10/29/2022 in  [place] Office Visit from 09/02/2022 in [place] Visit from 09/23/2018 in [place]  PHQ-2 Total Score 0 3 0  PHQ-9 Total Score -- 10 0       Receiving Psychotherapy: No   Treatment Plan/Recommendations:  Plan:  PDMP reviewed  Lamictal 300 mg daily  RTC 3 to 4 months  Patient advised to contact office with any questions, adverse effects, or acute worsening in signs and symptoms.   Counseled patient regarding potential benefits, risks, and side effects of Lamictal to include potential risk of Stevens-Johnson syndrome. Advised patient to stop taking Lamictal and contact office immediately if rash develops and to seek urgent medical attention if rash is severe and/or spreading quickly. Time spent with patient was 60 minutes. Greater than 50% of face to face time with patient was spent on counseling and coordination of care.    Aloha Gell, NP

## 2022-11-21 IMAGING — US US BREAST*L* LIMITED INC AXILLA
1 series · 8 of 8 positions shown · non-contrast
Comparison: Previous exam(s).

CLINICAL DATA: The patient was called back for left breast
asymmetry.

EXAM:
DIGITAL DIAGNOSTIC UNILATERAL LEFT MAMMOGRAM WITH TOMOSYNTHESIS AND
CAD; ULTRASOUND LEFT BREAST LIMITED
TECHNIQUE: Left digital diagnostic mammography and breast tomosynthesis was
performed. The images were evaluated with computer-aided detection.;
Targeted ultrasound examination of the left breast was performed

[Series 1: us breast*left* limited inc axilla · 0.08mm/px · 8 of 8 slices shown]
[im 1/8]
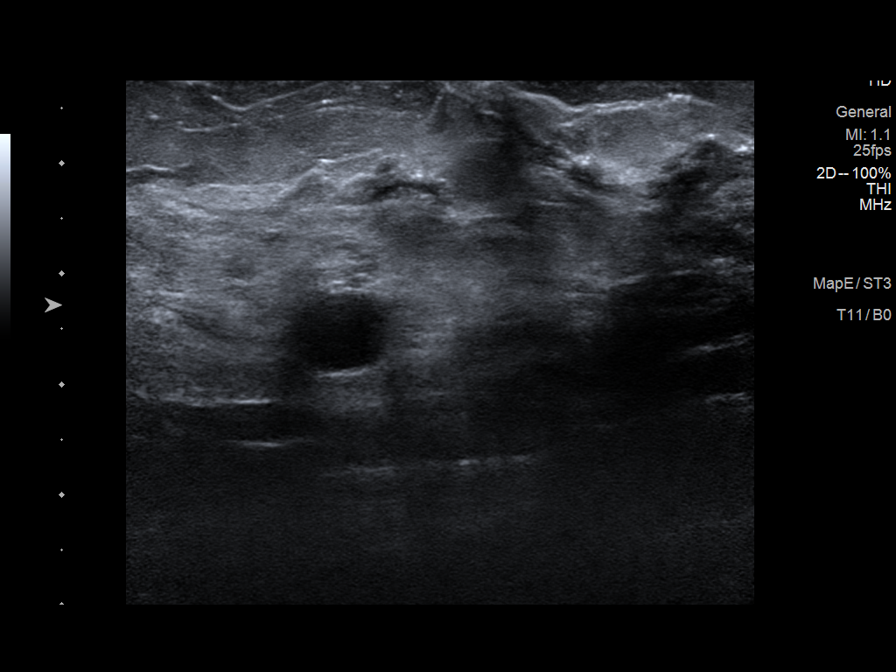
[im 2/8]
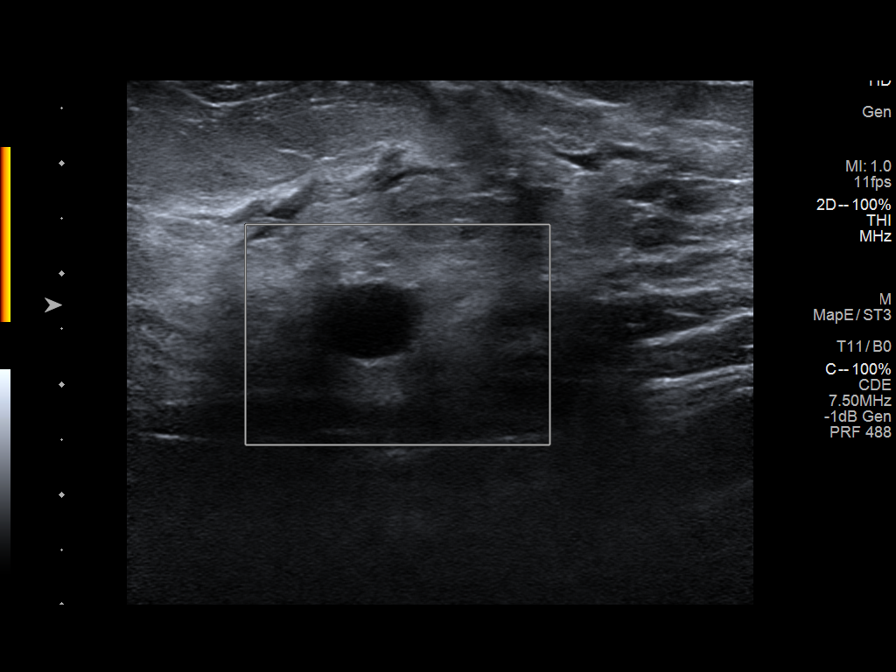
[im 3/8]
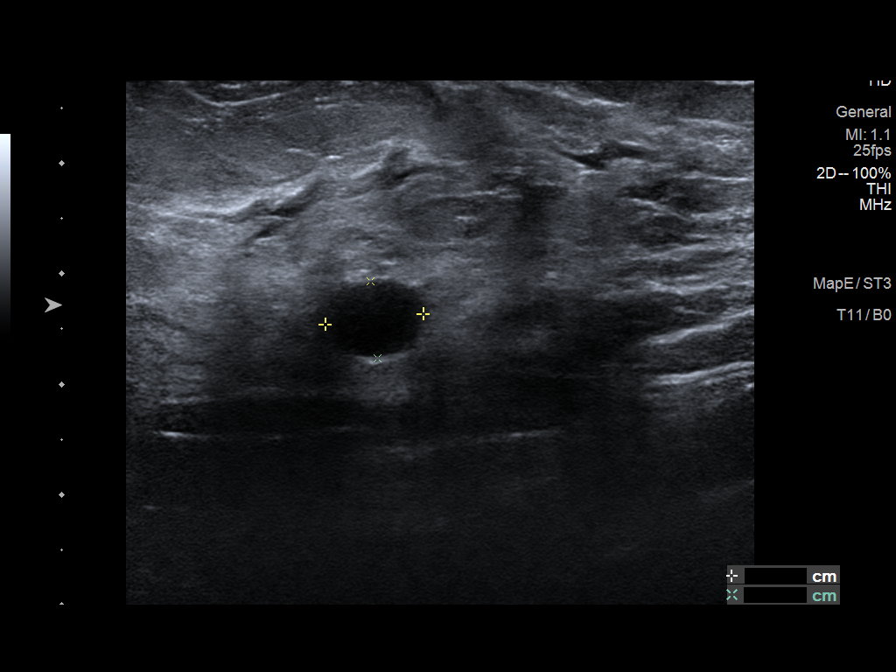
[im 4/8]
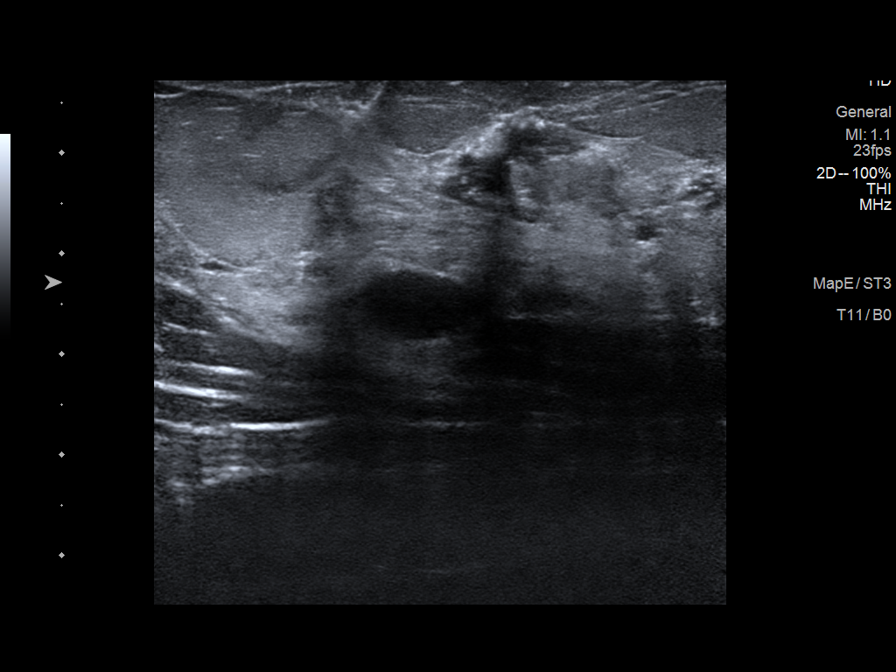
[im 5/8]
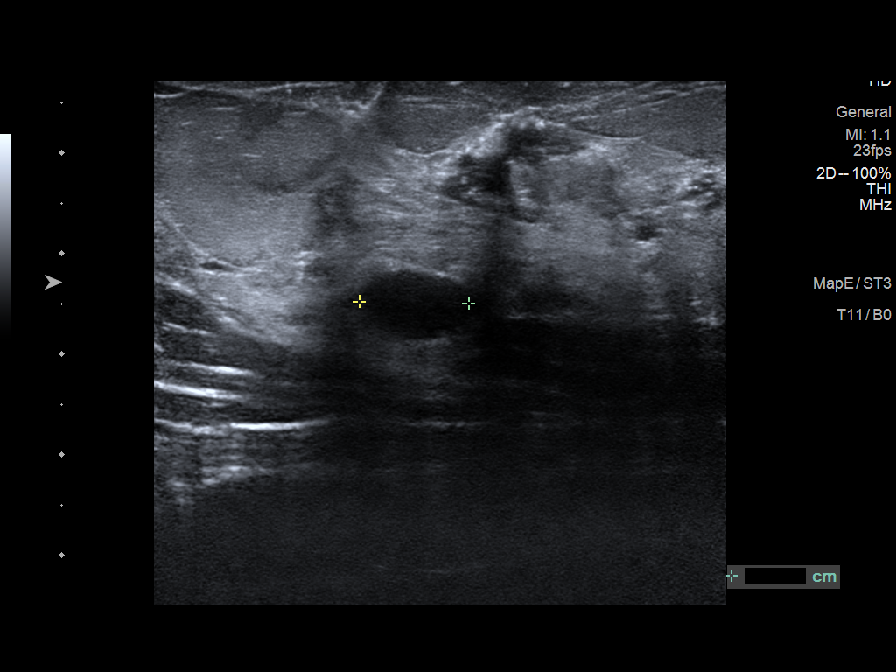
[im 6/8]
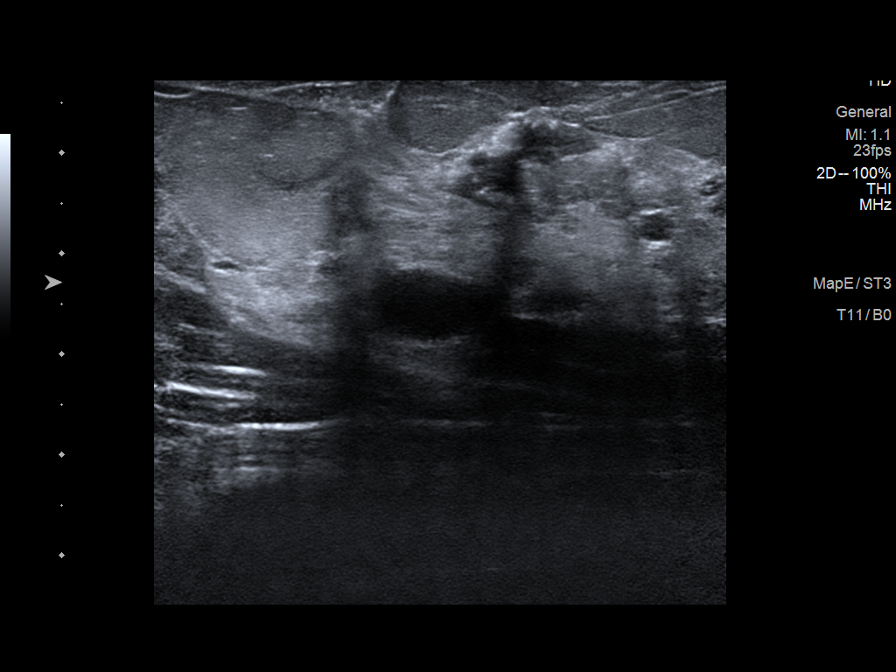
[im 7/8]
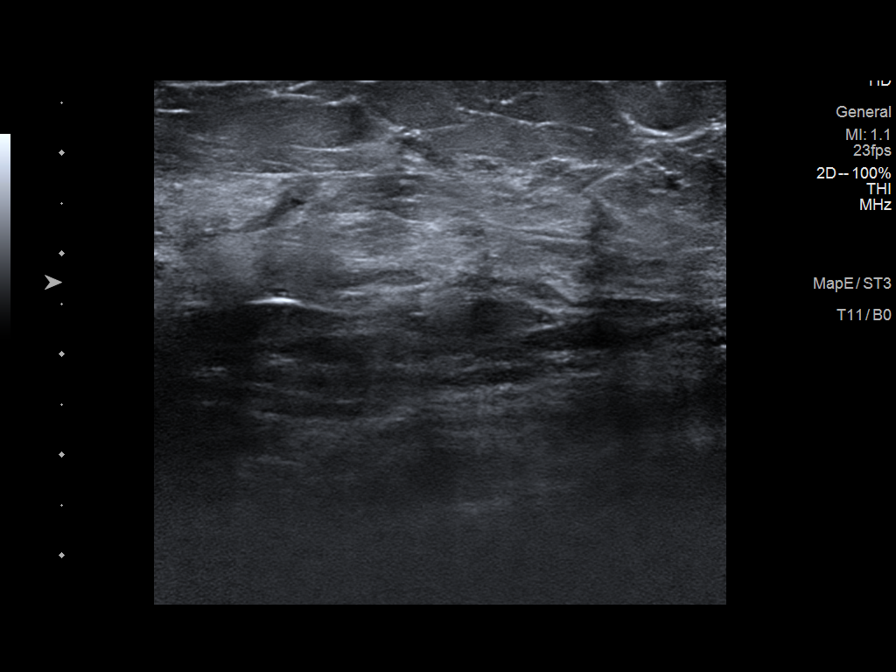
[im 8/8]
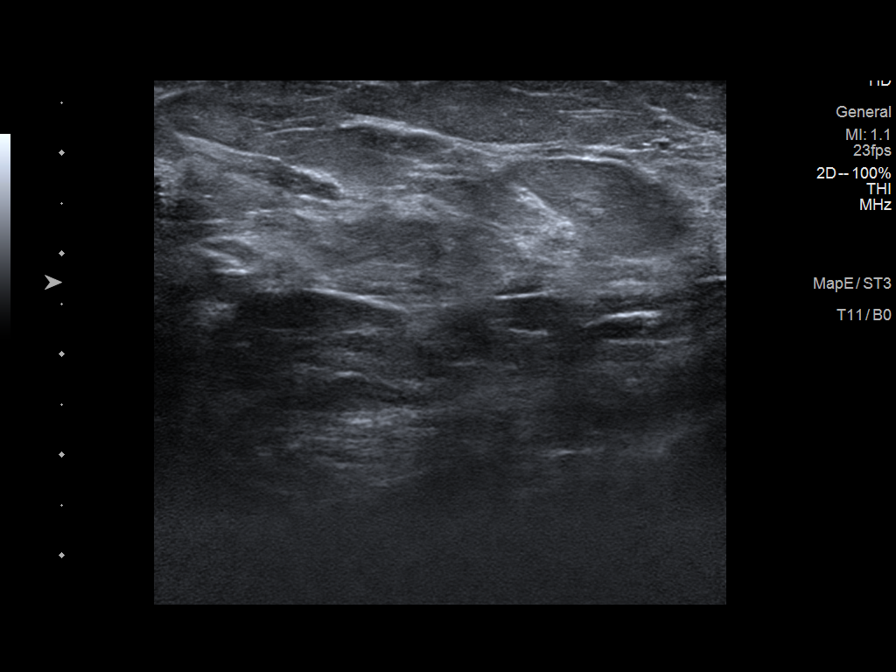

[8 of 8 positions shown; findings below may reference images not displayed]

ACR Breast Density Category c: The breast tissue is heterogeneously
dense, which may obscure small masses.
FINDINGS: The left breast asymmetry resolves on additional imaging.

On physical exam, no suspicious lumps are identified.

Targeted ultrasound is performed, showing fibrocystic changes in the
left breast between 2 and 4 o'clock. No other suspicious findings.
IMPRESSION: Fibrocystic changes.  No evidence of malignancy.

RECOMMENDATION:
Annual screening mammography.

I have discussed the findings and recommendations with the patient.
If applicable, a reminder letter will be sent to the patient
regarding the next appointment.

BI-RADS CATEGORY  2: Benign.

## 2022-11-21 IMAGING — MG MM DIGITAL DIAGNOSTIC UNILAT*L* W/ TOMO W/ CAD
6 series · 6 of 18 positions shown · non-contrast
Comparison: Previous exam(s).

CLINICAL DATA: The patient was called back for left breast
asymmetry.

EXAM:
DIGITAL DIAGNOSTIC UNILATERAL LEFT MAMMOGRAM WITH TOMOSYNTHESIS AND
CAD; ULTRASOUND LEFT BREAST LIMITED
TECHNIQUE: Left digital diagnostic mammography and breast tomosynthesis was
performed. The images were evaluated with computer-aided detection.;
Targeted ultrasound examination of the left breast was performed

[L MLO synth-2D (1 of 2)]
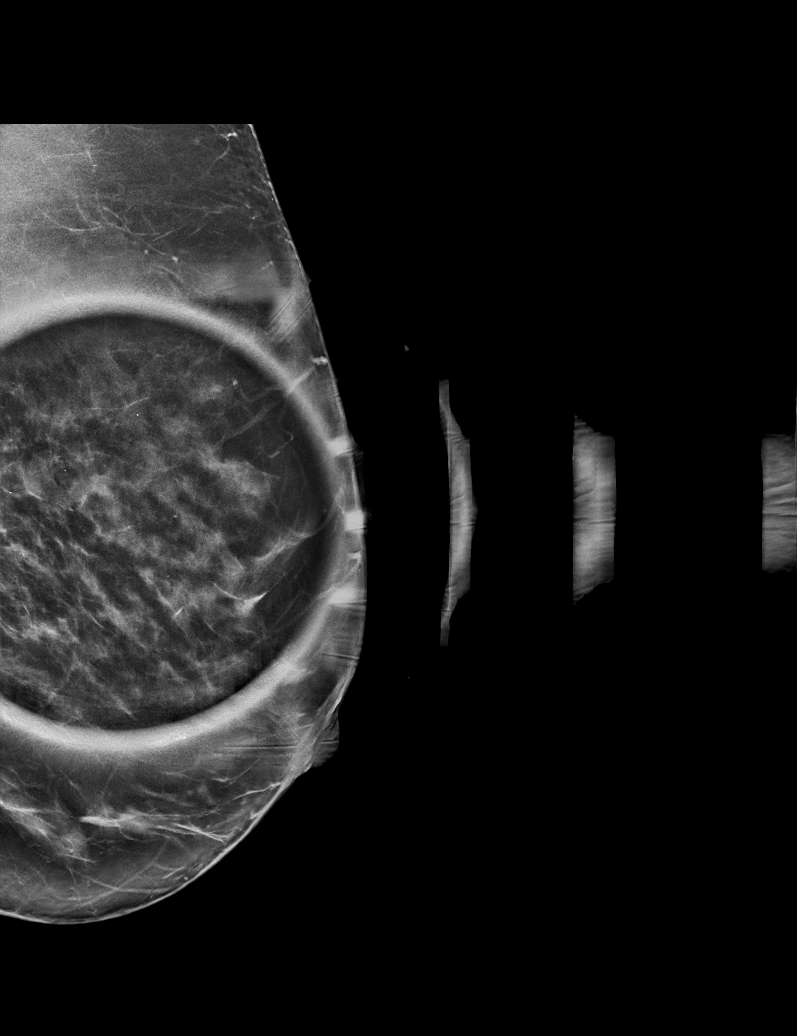

[L MLO synth-2D (2 of 2)]
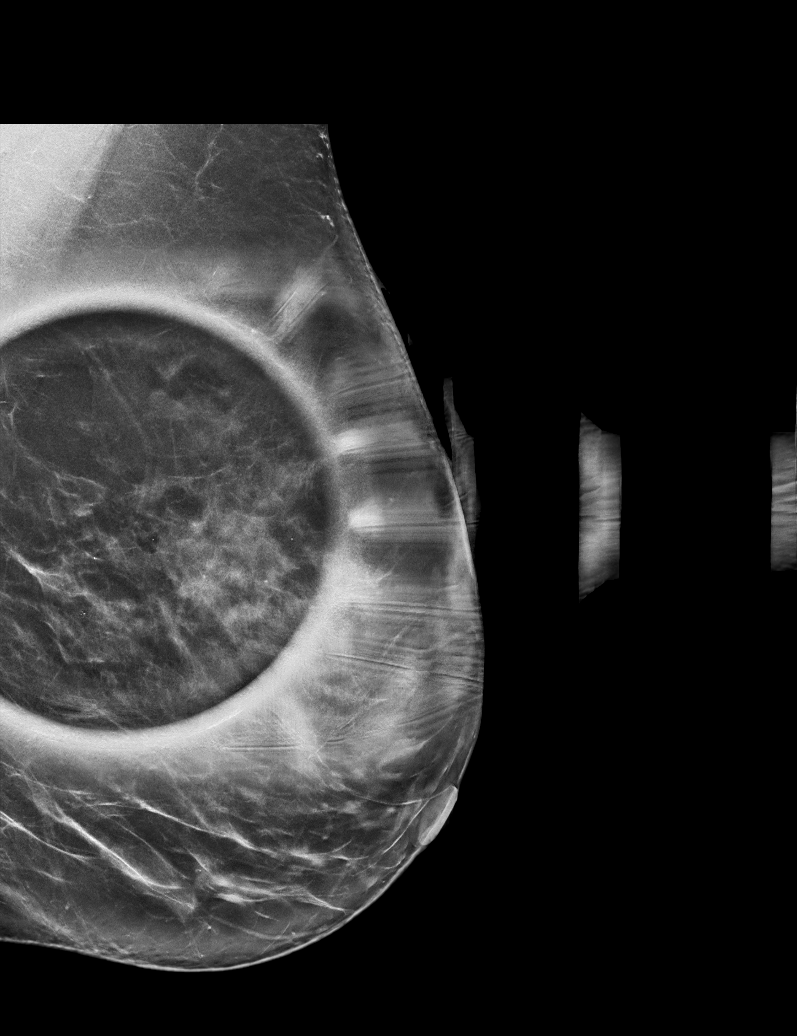

[L ML synth-2D]
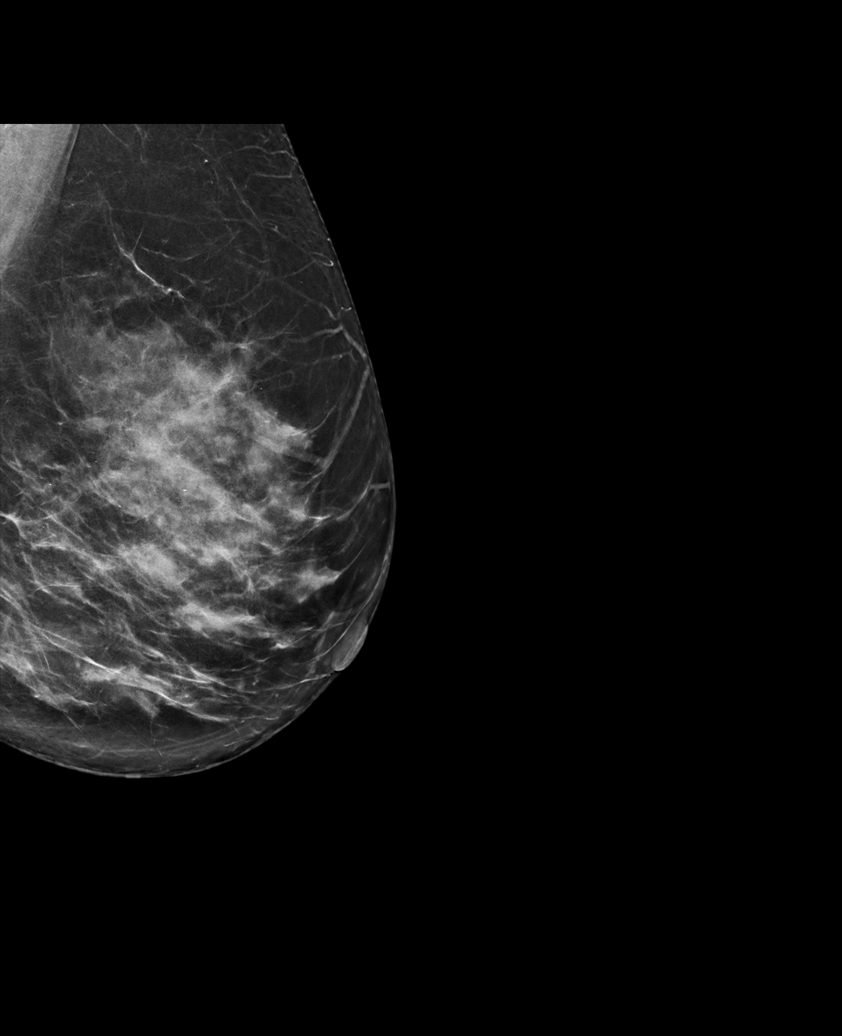

[L MLO tomo (1 of 2) · tomo slice 39/76.0]
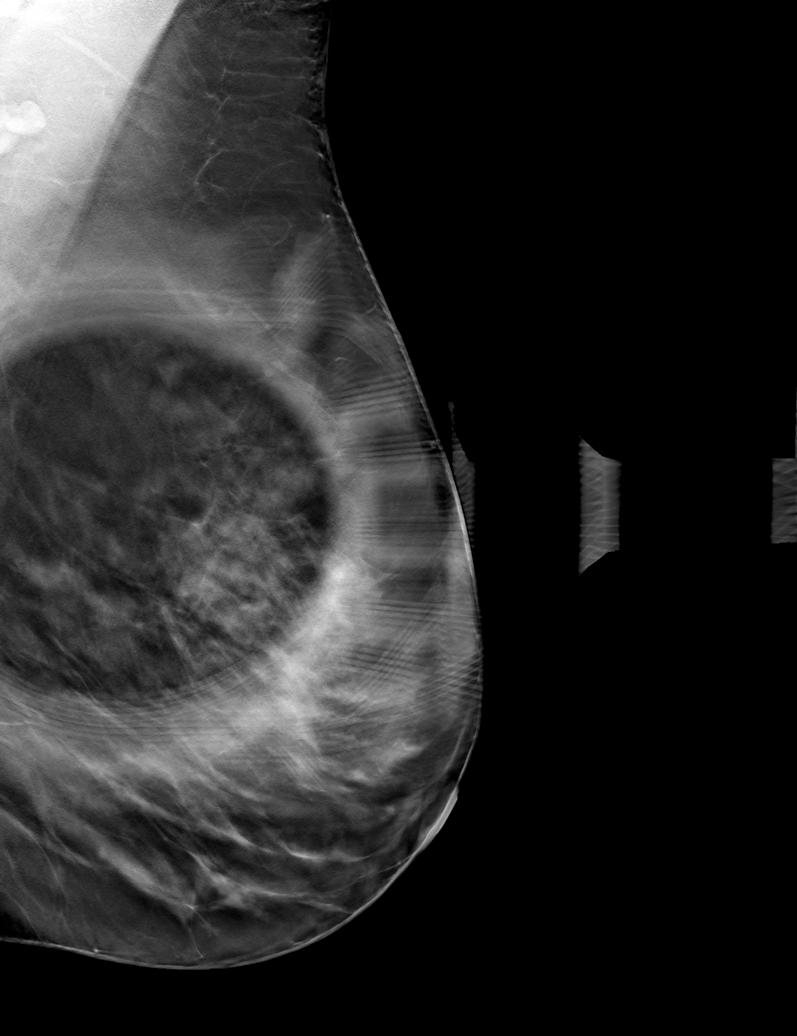

[L ML tomo · tomo slice 39/77.0]
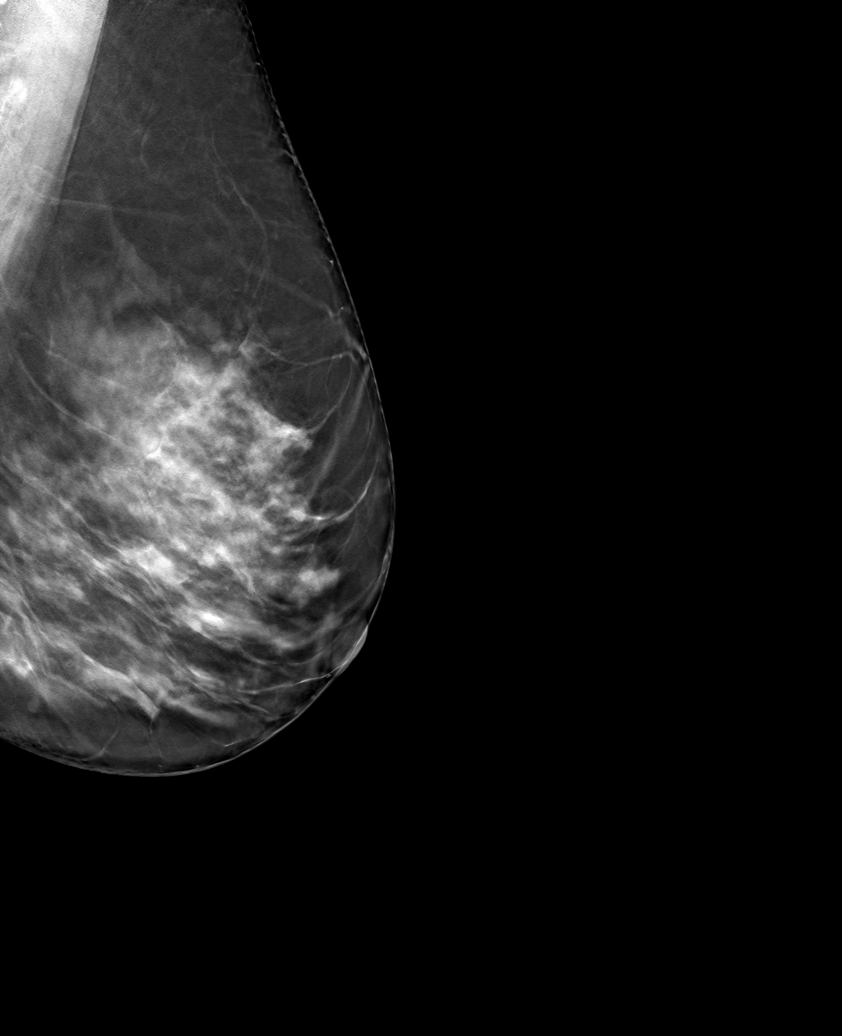

[L MLO tomo (2 of 2) · tomo slice 36/71.0]
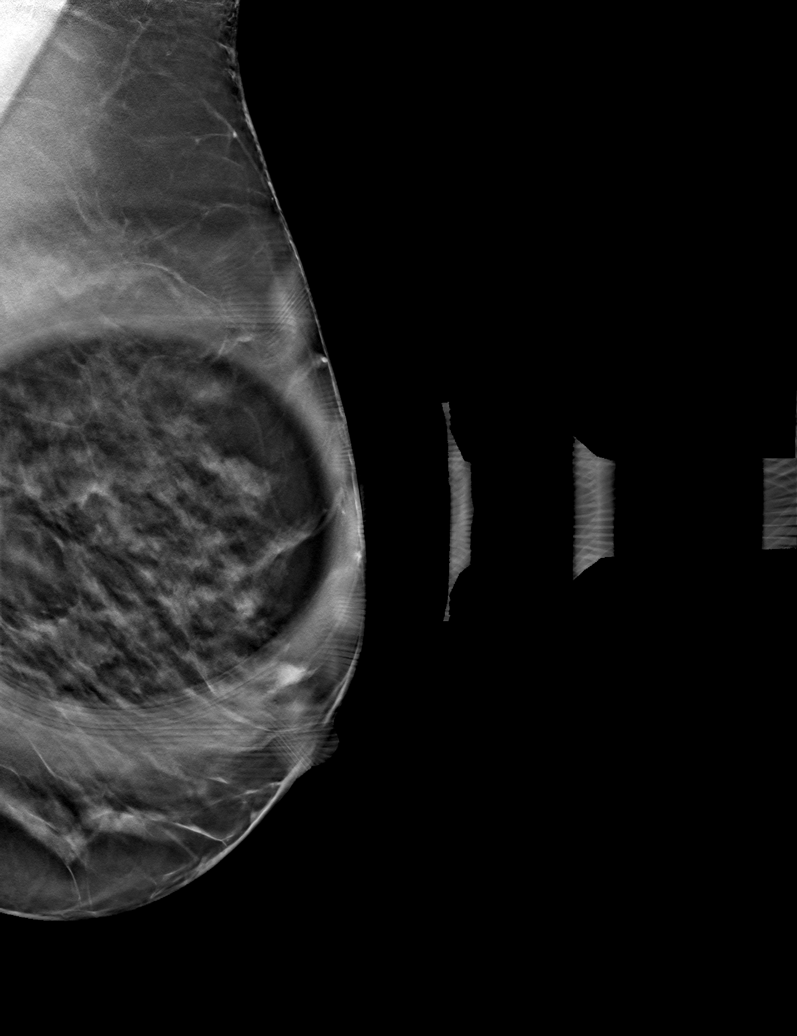

[6 of 18 positions shown; findings below may reference images not displayed]

ACR Breast Density Category c: The breast tissue is heterogeneously
dense, which may obscure small masses.
FINDINGS: The left breast asymmetry resolves on additional imaging.

On physical exam, no suspicious lumps are identified.

Targeted ultrasound is performed, showing fibrocystic changes in the
left breast between 2 and 4 o'clock. No other suspicious findings.
IMPRESSION: Fibrocystic changes.  No evidence of malignancy.

RECOMMENDATION:
Annual screening mammography.

I have discussed the findings and recommendations with the patient.
If applicable, a reminder letter will be sent to the patient
regarding the next appointment.

BI-RADS CATEGORY  2: Benign.

## 2023-02-03 ENCOUNTER — Encounter: Payer: Self-pay | Admitting: Adult Health

## 2023-02-03 ENCOUNTER — Ambulatory Visit: Payer: BC Managed Care – PPO | Admitting: Adult Health

## 2023-02-03 DIAGNOSIS — F331 Major depressive disorder, recurrent, moderate: Secondary | ICD-10-CM | POA: Diagnosis not present

## 2023-02-03 DIAGNOSIS — F411 Generalized anxiety disorder: Secondary | ICD-10-CM

## 2023-02-03 NOTE — Progress Notes (Signed)
Brooke Harmon YX:[id number] [birth year] 62 y.o.  Subjective:   Patient ID:  Brooke Harmon is a 62 y.o. (DOB 1961-06-15) female.  Chief Complaint: No chief complaint on file.   HPI Brooke Harmon presents to the office today for follow-up of MDD and GAD.  Describes mood today as "ok". Pleasant. Denies tearfulness.  Mood symptoms - reports periods of depression. Reports some anxiety and irritability. Reports worry, rumination, and over thinking. Denies obsessive thoughts and acts. Mood is variable. Feels like the Lamictal continues to work well. Stable interest and motivation. Taking medications as prescribed.  Energy levels stable. Active, does not have a regular exercise routine. Walking some days. Enjoys some usual interests and activities. Married. Lives with husband - "recently retired" and daughter. Sone lives in [place], [place]. Spending time with family. Appetite adequate. Weight gain - progressive. Sleeps well most nights. Averages 7 hours. Focus and concentration difficulties. Completing tasks. Managing aspects of household. Works full-time as a Physiological scientist. Denies SI or HI.  Denies AH or VH. Denies self harm.  Denies substance use.  Previous medication trials:  Carbamazepine Depakote Artane Lexapro - SI Latuda Zoloft Serzone Lamictal Xanax     PHQ2-9    Flowsheet Row Office Visit from 10/29/2022 in [place] at [facility] Visit from 09/02/2022 in [place] Office Visit from 09/23/2018 in [place] at [facility] Total Score 0 3 0  PHQ-9 Total Score -- 10 0        Review of Systems:  Review of Systems  Musculoskeletal:  Negative for gait problem.  Neurological:  Negative for tremors.  Psychiatric/Behavioral:         Please refer to HPI    Medications: I have reviewed the patient's current medications.  Current Outpatient  Medications  Medication Sig Dispense Refill   Azelastine HCl 137 MCG/SPRAY SOLN PLACE 1 SPRAY INTO BOTH NOSTRILS 2 (TWO) TIMES DAILY. USE IN EACH NOSTRIL AS DIRECTED 30 mL 1   Cholecalciferol (VITAMIN D3) 20 MCG (800 UNIT) TABS Take 1 capsule by mouth daily.     Cholecalciferol 1.25 MG (50000 UT) TABS 50,000 units PO qwk for 8 weeks. 8 tablet 0   ferrous sulfate 325 (65 FE) MG tablet Take 325 mg by mouth daily with breakfast.     hydrochlorothiazide (HYDRODIURIL) 25 MG tablet Take 1 tablet (25 mg total) by mouth daily. 90 tablet 3   Ivermectin 1 % CREA Apply 1 application topically daily as needed.     lamoTRIgine (LAMICTAL) 150 MG tablet Take 2 tablets (300 mg total) by mouth daily. 180 tablet 1   losartan (COZAAR) 100 MG tablet Take 1 tablet (100 mg total) by mouth daily. 90 tablet 3   pimecrolimus (ELIDEL) 1 % cream Apply topically 2 (two) times daily.     potassium chloride (KLOR-CON M10) 10 MEQ tablet TAKE 1 TABLET (10 MEQ TOTAL) BY MOUTH EVERY OTHER DAY. 45 tablet 3   triamcinolone cream (KENALOG) 0.1 % SMARTSIG:1 Application Topical 2-3 Times Daily     valACYclovir (VALTREX) 1000 MG tablet Take 2 tablets (2,000 mg total) by mouth 2 (two) times daily. Use AS NEEDED for cold sore out break 4 tablet 2   No current facility-administered medications for this visit.    Medication Side Effects: None  Allergies:  Allergies  Allergen Reactions   Lexapro  [Escitalopram Oxalate] Other (See Comments)    Pt reports she had sleeplessness, delusional thinking  and self cutting   Lisinopril     cough    Past Medical History:  Diagnosis Date   Chicken pox    Depression    Hypertension    Migraines    Psoriasis     Past Medical History, Surgical history, Social history, and Family history were reviewed and updated as appropriate.   Please see review of systems for further details on the patient's review from today.   Objective:   Physical Exam:  There were no vitals taken for this  visit.  Physical Exam Constitutional:      General: She is not in acute distress. Musculoskeletal:        General: No deformity.  Neurological:     Mental Status: She is alert and oriented to person, place, and time.     Coordination: Coordination normal.  Psychiatric:        Attention and Perception: Attention and perception normal. She does not perceive auditory or visual hallucinations.        Mood and Affect: Mood normal. Mood is not anxious or depressed. Affect is not labile, blunt, angry or inappropriate.        Speech: Speech normal.        Behavior: Behavior normal.        Thought Content: Thought content normal. Thought content is not paranoid or delusional. Thought content does not include homicidal or suicidal ideation. Thought content does not include homicidal or suicidal plan.        Cognition and Memory: Cognition and memory normal.        Judgment: Judgment normal.     Comments: Insight intact     Lab Review:     Component Value Date/Time   NA 139 06/13/2022 1517   K 3.7 06/13/2022 1517   CL 103 06/13/2022 1517   CO2 27 06/13/2022 1517   GLUCOSE 84 06/13/2022 1517   BUN 22 06/13/2022 1517   CREATININE 0.95 06/13/2022 1517   CALCIUM 9.2 06/13/2022 1517   PROT 6.7 06/13/2022 1517   ALBUMIN 4.4 06/13/2022 1517   AST 15 06/13/2022 1517   ALT 19 06/13/2022 1517   ALKPHOS 72 06/13/2022 1517   BILITOT 0.4 06/13/2022 1517       Component Value Date/Time   WBC 7.6 06/13/2022 1517   RBC 4.07 06/13/2022 1517   HGB 12.3 06/13/2022 1517   HCT 37.7 06/13/2022 1517   PLT 363.0 06/13/2022 1517   MCV 92.4 06/13/2022 1517   MCH 27.4 12/06/2019 1129   MCHC 32.7 06/13/2022 1517   RDW 14.1 06/13/2022 1517   LYMPHSABS 2.4 06/13/2022 1517   MONOABS 0.9 06/13/2022 1517   EOSABS 0.1 06/13/2022 1517   BASOSABS 0.0 06/13/2022 1517    No results found for: "POCLITH", "LITHIUM"   No results found for: "PHENYTOIN", "PHENOBARB", "VALPROATE", "CBMZ"   .res Assessment:  Plan:    Plan:  PDMP reviewed  Lamictal '300mg'$  daily  Consider low dose of Wellbutrin or Abilify  Low vitamin D levels  RTC 3 to 4 months  Patient advised to contact office with any questions, adverse effects, or acute worsening in signs and symptoms.   Counseled patient regarding potential benefits, risks, and side effects of Lamictal to include potential risk of Stevens-Johnson syndrome. Advised patient to stop taking Lamictal and contact office immediately if rash develops and to seek urgent medical attention if rash is severe and/or spreading quickly. Time spent with patient was 60 minutes. Greater than 50% of face to face  time with patient was spent on counseling and coordination of care.    There are no diagnoses linked to this encounter.   Please see After Visit Summary for patient specific instructions.  Future Appointments  Date Time Provider [place]  02/03/2023  9:00 AM Mohammedali Bedoy, Berdie Ogren, NP CP-CP None    No orders of the defined types were placed in this encounter.   -------------------------------

## 2023-02-10 ENCOUNTER — Telehealth: Payer: Self-pay | Admitting: Adult Health

## 2023-02-10 MED ORDER — BUPROPION HCL ER (XL) 150 MG PO TB24: 150.0000 mg | ORAL_TABLET | Freq: Every day | ORAL | 1 refills | Status: DC

## 2023-02-10 NOTE — Telephone Encounter (Signed)
Wellbutrin XL '150mg'$  every morning. Ty.

## 2023-02-10 NOTE — Telephone Encounter (Signed)
Rx sent to requested pharmacy

## 2023-02-10 NOTE — Telephone Encounter (Signed)
Margurete called and LM at 11:08 to report that she would like to try Wellbutrin.  Please send a prescription to [facility] on [address], [place]

## 2023-02-10 NOTE — Telephone Encounter (Signed)
Based on patient's last visit she is asking to start Wellbutrin. No history of seizures. Please tell me details and I'll send in Rx.

## 2023-03-04 ENCOUNTER — Other Ambulatory Visit: Payer: Self-pay | Admitting: Adult Health

## 2023-04-10 ENCOUNTER — Other Ambulatory Visit: Payer: Self-pay | Admitting: Adult Health

## 2023-05-06 ENCOUNTER — Encounter: Payer: Self-pay | Admitting: Adult Health

## 2023-05-06 ENCOUNTER — Ambulatory Visit: Payer: BC Managed Care – PPO | Admitting: Adult Health

## 2023-05-06 DIAGNOSIS — F39 Unspecified mood [affective] disorder: Secondary | ICD-10-CM

## 2023-05-06 DIAGNOSIS — F331 Major depressive disorder, recurrent, moderate: Secondary | ICD-10-CM | POA: Diagnosis not present

## 2023-05-06 MED ORDER — BUPROPION HCL ER (XL) 150 MG PO TB24: 150.0000 mg | ORAL_TABLET | Freq: Every day | ORAL | 1 refills | Status: DC

## 2023-05-06 MED ORDER — LAMOTRIGINE 150 MG PO TABS: 300.0000 mg | ORAL_TABLET | Freq: Every day | ORAL | 1 refills | Status: DC

## 2023-05-06 NOTE — Progress Notes (Signed)
Brooke Harmon [id number] 03/02/1961 62 y.o.  Subjective:   Patient ID:  Brooke Harmon is a 62 y.o. (DOB 07/31/1961) female.  Chief Complaint: No chief complaint on file.   HPI Brooke Harmon presents to the office today for follow-up of MDD and GAD.  Describes mood today as "ok". Pleasant. Denies tearfulness.  Mood symptoms - reports periods of depression are less frequent - feels like work business is a contributor. Reports anxiety and irritability at times. Reports worry, rumination, and over thinking. Denies obsessive thoughts and acts. Mood is variable. Stating "I'm still having some rough days". Feels like the addition of Wellbutrin has been helpful. Feels like the Lamictal continues to work well. Stable interest and motivation. Taking medications as prescribed.  Energy levels stable. Active, does not have a regular exercise routine. Walking some days. Enjoys some usual interests and activities. Married. Lives with husband and daughter. Son lives in [place], [place]. Spending time with family. Appetite adequate. Weight gain over the past 12 months. Sleeps well most nights. Averages 7 hours. Focus and concentration difficulties. Completing tasks. Managing aspects of household. Works full-time as a Designer, multimedia. Denies SI or HI.  Denies AH or VH. Denies self harm.  Denies substance use. Denies alcohol use.  Previous medication trials:  Carbamazepine Depakote Artane Lexapro - SI Latuda Zoloft Serzone Lamictal Xanax   PHQ2-9    Flowsheet Row Office Visit from 10/29/2022 in [facility] Visit from 09/02/2022 in [facility] Office Visit from 09/23/2018 in [facility] Total Score 0 3 0  PHQ-9 Total Score -- 10 0        Review of Systems:  Review of Systems  Musculoskeletal:  Negative for gait problem.  Neurological:   Negative for tremors.  Psychiatric/Behavioral:         Please refer to HPI    Medications: I have reviewed the patient's current medications.  Current Outpatient Medications  Medication Sig Dispense Refill   Azelastine HCl 137 MCG/SPRAY SOLN PLACE 1 SPRAY INTO BOTH NOSTRILS 2 (TWO) TIMES DAILY. USE IN EACH NOSTRIL AS DIRECTED 30 mL 1   buPROPion (WELLBUTRIN XL) 150 MG 24 hr tablet TAKE 1 TABLET BY MOUTH EVERY DAY 30 tablet 1   Cholecalciferol (VITAMIN D3) 20 MCG (800 UNIT) TABS Take 1 capsule by mouth daily.     Cholecalciferol 1.25 MG (50000 UT) TABS 50,000 units PO qwk for 8 weeks. 8 tablet 0   ferrous sulfate 325 (65 FE) MG tablet Take 325 mg by mouth daily with breakfast.     hydrochlorothiazide (HYDRODIURIL) 25 MG tablet Take 1 tablet (25 mg total) by mouth daily. 90 tablet 3   Ivermectin 1 % CREA Apply 1 application topically daily as needed.     lamoTRIgine (LAMICTAL) 150 MG tablet Take 2 tablets (300 mg total) by mouth daily. 180 tablet 1   losartan (COZAAR) 100 MG tablet Take 1 tablet (100 mg total) by mouth daily. 90 tablet 3   pimecrolimus (ELIDEL) 1 % cream Apply topically 2 (two) times daily.     potassium chloride (KLOR-CON M10) 10 MEQ tablet TAKE 1 TABLET (10 MEQ TOTAL) BY MOUTH EVERY OTHER DAY. 45 tablet 3   triamcinolone cream (KENALOG) 0.1 % SMARTSIG:1 Application Topical 2-3 Times Daily     valACYclovir (VALTREX) 1000 MG tablet Take 2 tablets (2,000 mg total) by mouth 2 (two) times daily. Use AS NEEDED for  cold sore out break 4 tablet 2   No current facility-administered medications for this visit.    Medication Side Effects: None  Allergies:  Allergies  Allergen Reactions   Lexapro  [Escitalopram Oxalate] Other (See Comments)    Pt reports she had sleeplessness, delusional thinking and self cutting   Lisinopril     cough    Past Medical History:  Diagnosis Date   Chicken pox    Depression    Hypertension    Migraines    Psoriasis     Past Medical  History, Surgical history, Social history, and Family history were reviewed and updated as appropriate.   Please see review of systems for further details on the patient's review from today.   Objective:   Physical Exam:  There were no vitals taken for this visit.  Physical Exam Constitutional:      General: She is not in acute distress. Musculoskeletal:        General: No deformity.  Neurological:     Mental Status: She is alert and oriented to person, place, and time.     Coordination: Coordination normal.  Psychiatric:        Attention and Perception: Attention and perception normal. She does not perceive auditory or visual hallucinations.        Mood and Affect: Mood normal. Mood is not anxious or depressed. Affect is not labile, blunt, angry or inappropriate.        Speech: Speech normal.        Behavior: Behavior normal.        Thought Content: Thought content normal. Thought content is not paranoid or delusional. Thought content does not include homicidal or suicidal ideation. Thought content does not include homicidal or suicidal plan.        Cognition and Memory: Cognition and memory normal.        Judgment: Judgment normal.     Comments: Insight intact     Lab Review:     Component Value Date/Time   NA 139 06/13/2022 1517   K 3.7 06/13/2022 1517   CL 103 06/13/2022 1517   CO2 27 06/13/2022 1517   GLUCOSE 84 06/13/2022 1517   BUN 22 06/13/2022 1517   CREATININE 0.95 06/13/2022 1517   CALCIUM 9.2 06/13/2022 1517   PROT 6.7 06/13/2022 1517   ALBUMIN 4.4 06/13/2022 1517   AST 15 06/13/2022 1517   ALT 19 06/13/2022 1517   ALKPHOS 72 06/13/2022 1517   BILITOT 0.4 06/13/2022 1517       Component Value Date/Time   WBC 7.6 06/13/2022 1517   RBC 4.07 06/13/2022 1517   HGB 12.3 06/13/2022 1517   HCT 37.7 06/13/2022 1517   PLT 363.0 06/13/2022 1517   MCV 92.4 06/13/2022 1517   MCH 27.4 12/06/2019 1129   MCHC 32.7 06/13/2022 1517   RDW 14.1 06/13/2022 1517    LYMPHSABS 2.4 06/13/2022 1517   MONOABS 0.9 06/13/2022 1517   EOSABS 0.1 06/13/2022 1517   BASOSABS 0.0 06/13/2022 1517    No results found for: "POCLITH", "LITHIUM"   No results found for: "PHENYTOIN", "PHENOBARB", "VALPROATE", "CBMZ"   .res Assessment: Plan:    Plan:  PDMP reviewed  Lamictal 300mg  daily  Wellbutrin XL 150mg  every morning  Low vitamin D levels  RTC 6 months  Patient advised to contact office with any questions, adverse effects, or acute worsening in signs and symptoms.   Counseled patient regarding potential benefits, risks, and side effects of Lamictal to include  potential risk of Stevens-Johnson syndrome. Advised patient to stop taking Lamictal and contact office immediately if rash develops and to seek urgent medical attention if rash is severe and/or spreading quickly.    Diagnoses and all orders for this visit:  Major depressive disorder, recurrent episode, moderate (HCC)  Unspecified mood (affective) disorder (HCC)     Please see After Visit Summary for patient specific instructions.  Future Appointments  Date Time Provider Department Center  05/06/2023  8:40 AM Jacari Iannello, Thereasa Solo, NP [facility] None    No orders of the defined types were placed in this encounter.   -------------------------------

## 2023-06-12 LAB — HM MAMMOGRAPHY

## 2023-07-09 ENCOUNTER — Ambulatory Visit (INDEPENDENT_AMBULATORY_CARE_PROVIDER_SITE_OTHER): Payer: BC Managed Care – PPO | Admitting: Family

## 2023-07-09 VITALS — BP 120/70 | HR 90 | Temp 98.1°F | Ht 62.0 in | Wt 146.2 lb

## 2023-07-09 DIAGNOSIS — R053 Chronic cough: Secondary | ICD-10-CM

## 2023-07-09 DIAGNOSIS — I10 Essential (primary) hypertension: Secondary | ICD-10-CM

## 2023-07-09 DIAGNOSIS — Z0000 Encounter for general adult medical examination without abnormal findings: Secondary | ICD-10-CM | POA: Diagnosis not present

## 2023-07-09 DIAGNOSIS — E782 Mixed hyperlipidemia: Secondary | ICD-10-CM

## 2023-07-09 DIAGNOSIS — Z8639 Personal history of other endocrine, nutritional and metabolic disease: Secondary | ICD-10-CM

## 2023-07-09 DIAGNOSIS — R790 Abnormal level of blood mineral: Secondary | ICD-10-CM

## 2023-07-09 MED ORDER — AMLODIPINE BESYLATE 5 MG PO TABS: 5.0000 mg | ORAL_TABLET | Freq: Every day | ORAL | 1 refills | Status: DC

## 2023-07-09 NOTE — Patient Instructions (Signed)
Stop losartan Start amlodipine in its place.  Please monitor your blood pressure  If no resolution of cough in the next couple of weeks, you may either resume losartan or either continue amlodipine based on your preference.    At that point I would recommend a trial of Pepcid AC, Claritin and referral to pulmonology.  Health Maintenance for Postmenopausal Women Menopause is a normal process in which your ability to get pregnant comes to an end. This process happens slowly over many months or years, usually between the ages of 62 and 57. Menopause is complete when you have missed your menstrual period for 12 months. It is important to talk with your health care provider about some of the most common conditions that affect women after menopause (postmenopausal women). These include heart disease, cancer, and bone loss (osteoporosis). Adopting a healthy lifestyle and getting preventive care can help to promote your health and wellness. The actions you take can also lower your chances of developing some of these common conditions. What are the signs and symptoms of menopause? During menopause, you may have the following symptoms: Hot flashes. These can be moderate or severe. Night sweats. Decrease in sex drive. Mood swings. Headaches. Tiredness (fatigue). Irritability. Memory problems. Problems falling asleep or staying asleep. Talk with your health care provider about treatment options for your symptoms. Do I need hormone replacement therapy? Hormone replacement therapy is effective in treating symptoms that are caused by menopause, such as hot flashes and night sweats. Hormone replacement carries certain risks, especially as you become older. If you are thinking about using estrogen or estrogen with progestin, discuss the benefits and risks with your health care provider. How can I reduce my risk for heart disease and stroke? The risk of heart disease, heart attack, and stroke increases as  you age. One of the causes may be a change in the body's hormones during menopause. This can affect how your body uses dietary fats, triglycerides, and cholesterol. Heart attack and stroke are medical emergencies. There are many things that you can do to help prevent heart disease and stroke. Watch your blood pressure High blood pressure causes heart disease and increases the risk of stroke. This is more likely to develop in people who have high blood pressure readings or are overweight. Have your blood pressure checked: Every 3-5 years if you are 62-29 years of age. Every year if you are 62 years old or older. Eat a healthy diet  Eat a diet that includes plenty of vegetables, fruits, low-fat dairy products, and lean protein. Do not eat a lot of foods that are high in solid fats, added sugars, or sodium. Get regular exercise Get regular exercise. This is one of the most important things you can do for your health. Most adults should: Try to exercise for at least 150 minutes each week. The exercise should increase your heart rate and make you sweat (moderate-intensity exercise). Try to do strengthening exercises at least twice each week. Do these in addition to the moderate-intensity exercise. Spend less time sitting. Even light physical activity can be beneficial. Other tips Work with your health care provider to achieve or maintain a healthy weight. Do not use any products that contain nicotine or tobacco. These products include cigarettes, chewing tobacco, and vaping devices, such as e-cigarettes. If you need help quitting, ask your health care provider. Know your numbers. Ask your health care provider to check your cholesterol and your blood sugar (glucose). Continue to have your blood tested  as directed by your health care provider. Do I need screening for cancer? Depending on your health history and family history, you may need to have cancer screenings at different stages of your life. This  may include screening for: 62 Breast cancer. Cervical cancer. Lung cancer. Colorectal cancer. What is my risk for osteoporosis? After menopause, you may be at increased risk for osteoporosis. Osteoporosis is a condition in which bone destruction happens more quickly than new bone creation. To help prevent osteoporosis or the bone fractures that can happen because of osteoporosis, you may take the following actions: If you are 62-29 years old, get at least 1,000 mg of calcium and at least 600 international units (IU) of vitamin D per day. If you are older than age 62 but younger than age 25, get at least 1,200 mg of calcium and at least 600 international units (IU) of vitamin D per day. If you are older than age 62, get at least 1,200 mg of calcium and at least 800 international units (IU) of vitamin D per day. Smoking and drinking excessive alcohol increase the risk of osteoporosis. Eat foods that are rich in calcium and vitamin D, and do weight-bearing exercises several times each week as directed by your health care provider. How does menopause affect my mental health? Depression may occur at any age, but it is more common as you become older. Common symptoms of depression include: Feeling depressed. Changes in sleep patterns. Changes in appetite or eating patterns. Feeling an overall lack of motivation or enjoyment of activities that you previously enjoyed. Frequent crying spells. Talk with your health care provider if you think that you are experiencing any of these symptoms. General instructions See your health care provider for regular wellness exams and vaccines. This may include: Scheduling regular health, dental, and eye exams. Getting and maintaining your vaccines. These include: Influenza vaccine. Get this vaccine each year before the flu season begins. Pneumonia vaccine. Shingles vaccine. Tetanus, diphtheria, and pertussis (Tdap) booster vaccine. Your health care provider may also  recommend other immunizations. Tell your health care provider if you have ever been abused or do not feel safe at home. Summary Menopause is a normal process in which your ability to get pregnant comes to an end. This condition causes hot flashes, night sweats, decreased interest in sex, mood swings, headaches, or lack of sleep. Treatment for this condition may include hormone replacement therapy. Take actions to keep yourself healthy, including exercising regularly, eating a healthy diet, watching your weight, and checking your blood pressure and blood sugar levels. Get screened for cancer and depression. Make sure that you are up to date with all your vaccines. This information is not intended to replace advice given to you by your health care provider. Make sure you discuss any questions you have with your health care provider. Document Revised: 04/09/2021 Document Reviewed: 04/09/2021 Elsevier Patient Education  2024 [employer].

## 2023-07-09 NOTE — Assessment & Plan Note (Signed)
Patient politely declines clinical breast exam as she was recently seen her GYN.  Deferred pelvic exam in the absence of complaints and reported Pap smear is UTD. requesting most recent Pap smear.  Colonoscopy is up-to-date.

## 2023-07-09 NOTE — Assessment & Plan Note (Signed)
Unchanged.  Patient agreed to trial stop losartan as patient previously had cough on lisinopril.  Patient will start amlodipine in its place.  We discussed trial of Pepcid AC, antihistamine as well as referral to pulmonology if no resolution.

## 2023-07-09 NOTE — Progress Notes (Signed)
Assessment & Plan:  Routine physical examination Assessment & Plan: Patient politely declines clinical breast exam as she was recently seen her GYN.  Deferred pelvic exam in the absence of complaints and reported Pap smear is UTD. requesting most recent Pap smear.  Colonoscopy is up-to-date.  Orders: -     CBC with Differential/Platelet; Future -     Comprehensive metabolic panel; Future -     Hemoglobin A1c; Future  Essential hypertension Assessment & Plan: Chronic, stable.  Stop losartan d/t cough.  Start amlodipine 5 mg daily, continue hydrochlorothiazide 25 mg daily with potassium chloride 10 mEq qod  Orders: -     Comprehensive metabolic panel; Future -     Hemoglobin A1c; Future -     TSH; Future  Chronic cough Assessment & Plan: Unchanged.  Patient agreed to trial stop losartan as patient previously had cough on lisinopril.  Patient will start amlodipine in its place.  We discussed trial of Pepcid AC, antihistamine as well as referral to pulmonology if no resolution.  Orders: -     amLODIPine Besylate; Take 1 tablet (5 mg total) by mouth daily.  Dispense: 90 tablet; Refill: 1  Mixed hyperlipidemia  History of vitamin D deficiency -     VITAMIN D 25 Hydroxy (Vit-D Deficiency, Fractures); Future  Low ferritin -     Iron, TIBC and Ferritin Panel; Future     Return precautions given.   Risks, benefits, and alternatives of the medications and treatment plan prescribed today were discussed, and patient expressed understanding.   Education regarding symptom management and diagnosis given to patient on AVS either electronically or printed.  Return in about 6 weeks (around 08/20/2023) for Fasting labs in 2-3 weeks.  Rennie Plowman, FNP  Subjective:    Patient ID: Brooke Harmon, female    DOB: September 09, 1961, 62 y.o.   MRN: [id number]  CC: Saidie Layer is a 62 y.o. female who presents today for physical exam.    HPI: She continues to have chronic  cough.  At times she will have congestion.  No chest pain, fever.  She did not try Pepcid AC.  She is not on antihistamine   She donates blood regularly, every 8 weeks.  She is compliant with ferrous sulfate 325 mg once daily   Following with dermatology  Colorectal Cancer Screening: UTD , unable to see full report in epic . Dr Charna Elizabeth, repeat in 5 years  Breast Cancer Screening: Mammogram UTD, reported normal 06/11/23.  Cervical Cancer Screening: UTD, following with GYN,[facility],  reports normal and done 06/11/23  Bone Health screening/DEXA for 65+: No increased fracture risk. Defer screening at this time.  Lung Cancer Screening: Doesn't have 20 year pack year history and age > 66 years yo 72 years        Tetanus - UTD  Alcohol use: Occasional Smoking/tobacco use: Nonsmoker.    Health Maintenance  Topic Date Due   COVID-19 Vaccine (3 - 2023-24 season) 11/30/2022   Flu Shot  03/01/2024*   Mammogram  06/10/2025   Colon Cancer Screening  06/16/2025   Pap Smear  06/10/2026   DTaP/Tdap/Td vaccine (3 - Td or Tdap) 08/28/2026   Hepatitis C Screening  Completed   HIV Screening  Completed   Zoster (Shingles) Vaccine  Completed   HPV Vaccine  Aged Out  *Topic was postponed. The date shown is not the original due date.    ALLERGIES: Lexapro  [escitalopram oxalate] and Lisinopril  Current Outpatient  Medications on File Prior to Visit  Medication Sig Dispense Refill   Azelastine HCl 137 MCG/SPRAY SOLN PLACE 1 SPRAY INTO BOTH NOSTRILS 2 (TWO) TIMES DAILY. USE IN EACH NOSTRIL AS DIRECTED 30 mL 1   buPROPion (WELLBUTRIN XL) 150 MG 24 hr tablet Take 1 tablet (150 mg total) by mouth daily. 90 tablet 1   Cholecalciferol (VITAMIN D3) 20 MCG (800 UNIT) TABS Take 1 capsule by mouth daily.     ferrous sulfate 325 (65 FE) MG tablet Take 325 mg by mouth daily with breakfast.     hydrochlorothiazide (HYDRODIURIL) 25 MG tablet Take 1 tablet (25 mg total) by mouth daily. 90 tablet 3    Ivermectin 1 % CREA Apply 1 application topically daily as needed.     lamoTRIgine (LAMICTAL) 150 MG tablet Take 2 tablets (300 mg total) by mouth daily. 180 tablet 1   pimecrolimus (ELIDEL) 1 % cream Apply topically 2 (two) times daily.     potassium chloride (KLOR-CON M10) 10 MEQ tablet TAKE 1 TABLET (10 MEQ TOTAL) BY MOUTH EVERY OTHER DAY. 45 tablet 3   triamcinolone cream (KENALOG) 0.1 % SMARTSIG:1 Application Topical 2-3 Times Daily     valACYclovir (VALTREX) 1000 MG tablet Take 2 tablets (2,000 mg total) by mouth 2 (two) times daily. Use AS NEEDED for cold sore out break 4 tablet 2   No current facility-administered medications on file prior to visit.    Review of Systems  Constitutional:  Negative for chills, fever and unexpected weight change.  HENT:  Positive for congestion. Negative for rhinorrhea and sinus pain.   Respiratory:  Positive for cough. Negative for shortness of breath.   Cardiovascular:  Negative for chest pain, palpitations and leg swelling.  Gastrointestinal:  Negative for nausea and vomiting.  Musculoskeletal:  Negative for arthralgias and myalgias.  Skin:  Negative for rash.  Neurological:  Negative for headaches.  Hematological:  Negative for adenopathy.  Psychiatric/Behavioral:  Negative for confusion.       Objective:    BP 120/70   Pulse 90   Temp 98.1 F (36.7 C) (Oral)   Ht 5\' 2"  (1.575 m)   Wt 146 lb 3.2 oz (66.3 kg)   SpO2 98%   BMI 26.74 kg/m   BP Readings from Last 3 Encounters:  07/09/23 120/70  10/29/22 126/76  06/08/21 122/74   Wt Readings from Last 3 Encounters:  07/09/23 146 lb 3.2 oz (66.3 kg)  10/29/22 154 lb 9.6 oz (70.1 kg)  06/08/21 154 lb 6.4 oz (70 kg)    Physical Exam Vitals reviewed.  Constitutional:      Appearance: She is well-developed.  HENT:     Head: Normocephalic and atraumatic.     Right Ear: Hearing, tympanic membrane, ear canal and external ear normal. No decreased hearing noted. No drainage, swelling or  tenderness. No middle ear effusion. No foreign body. Tympanic membrane is not erythematous or bulging.     Left Ear: Hearing, tympanic membrane, ear canal and external ear normal. No decreased hearing noted. No drainage, swelling or tenderness.  No middle ear effusion. No foreign body. Tympanic membrane is not erythematous or bulging.     Nose: Nose normal. No rhinorrhea.     Right Sinus: No maxillary sinus tenderness or frontal sinus tenderness.     Left Sinus: No maxillary sinus tenderness or frontal sinus tenderness.     Mouth/Throat:     Pharynx: Uvula midline. No oropharyngeal exudate or posterior oropharyngeal erythema.  Tonsils: No tonsillar abscesses.  Eyes:     Conjunctiva/sclera: Conjunctivae normal.  Neck:     Thyroid: No thyroid mass or thyromegaly.  Cardiovascular:     Rate and Rhythm: Normal rate and regular rhythm.     Pulses: Normal pulses.     Heart sounds: Normal heart sounds.  Pulmonary:     Effort: Pulmonary effort is normal.     Breath sounds: Normal breath sounds. No wheezing, rhonchi or rales.  Lymphadenopathy:     Head:     Right side of head: No submental, submandibular, tonsillar, preauricular, posterior auricular or occipital adenopathy.     Left side of head: No submental, submandibular, tonsillar, preauricular, posterior auricular or occipital adenopathy.     Cervical: No cervical adenopathy.  Skin:    General: Skin is warm and dry.  Neurological:     Mental Status: She is alert.  Psychiatric:        Speech: Speech normal.        Behavior: Behavior normal.        Thought Content: Thought content normal.

## 2023-07-09 NOTE — Assessment & Plan Note (Signed)
Chronic, stable.  Stop losartan d/t cough.  Start amlodipine 5 mg daily, continue hydrochlorothiazide 25 mg daily with potassium chloride 10 mEq qod

## 2023-07-14 ENCOUNTER — Telehealth: Payer: Self-pay

## 2023-07-14 NOTE — Telephone Encounter (Signed)
Spoke to [place] @ [facility] office [facility] and  she stated that she would fax pap results over to [place] today. Fax # was given

## 2023-07-17 ENCOUNTER — Encounter (INDEPENDENT_AMBULATORY_CARE_PROVIDER_SITE_OTHER): Payer: Self-pay

## 2023-07-28 ENCOUNTER — Other Ambulatory Visit (INDEPENDENT_AMBULATORY_CARE_PROVIDER_SITE_OTHER): Payer: BC Managed Care – PPO

## 2023-07-28 DIAGNOSIS — Z8639 Personal history of other endocrine, nutritional and metabolic disease: Secondary | ICD-10-CM

## 2023-07-28 DIAGNOSIS — E782 Mixed hyperlipidemia: Secondary | ICD-10-CM

## 2023-07-28 DIAGNOSIS — Z0000 Encounter for general adult medical examination without abnormal findings: Secondary | ICD-10-CM | POA: Diagnosis not present

## 2023-07-28 DIAGNOSIS — I10 Essential (primary) hypertension: Secondary | ICD-10-CM

## 2023-07-28 DIAGNOSIS — R790 Abnormal level of blood mineral: Secondary | ICD-10-CM

## 2023-07-28 LAB — CBC WITH DIFFERENTIAL/PLATELET
Basophils Absolute: 0.1 10*3/uL (ref 0.0–0.1)
Basophils Relative: 1.2 % (ref 0.0–3.0)
Eosinophils Absolute: 0.1 10*3/uL (ref 0.0–0.7)
Eosinophils Relative: 1.9 % (ref 0.0–5.0)
HCT: 41.1 % (ref 36.0–46.0)
Hemoglobin: 13.4 g/dL (ref 12.0–15.0)
Lymphocytes Relative: 20.2 % (ref 12.0–46.0)
Lymphs Abs: 1.5 10*3/uL (ref 0.7–4.0)
MCHC: 32.6 g/dL (ref 30.0–36.0)
MCV: 91.4 fl (ref 78.0–100.0)
Monocytes Absolute: 0.9 10*3/uL (ref 0.1–1.0)
Monocytes Relative: 12.1 % — ABNORMAL HIGH (ref 3.0–12.0)
Neutro Abs: 4.9 10*3/uL (ref 1.4–7.7)
Neutrophils Relative %: 64.6 % (ref 43.0–77.0)
Platelets: 457 10*3/uL — ABNORMAL HIGH (ref 150.0–400.0)
RBC: 4.5 Mil/uL (ref 3.87–5.11)
RDW: 14.4 % (ref 11.5–15.5)
WBC: 7.6 10*3/uL (ref 4.0–10.5)

## 2023-07-28 LAB — COMPREHENSIVE METABOLIC PANEL
ALT: 21 U/L (ref 0–35)
AST: 16 U/L (ref 0–37)
Albumin: 4.3 g/dL (ref 3.5–5.2)
Alkaline Phosphatase: 89 U/L (ref 39–117)
BUN: 16 mg/dL (ref 6–23)
CO2: 30 mEq/L (ref 19–32)
Calcium: 9.4 mg/dL (ref 8.4–10.5)
Chloride: 101 mEq/L (ref 96–112)
Creatinine, Ser: 0.89 mg/dL (ref 0.40–1.20)
GFR: 69.53 mL/min (ref >60.00)
Glucose, Bld: 100 mg/dL — ABNORMAL HIGH (ref 70–99)
Potassium: 3.9 mEq/L (ref 3.5–5.1)
Sodium: 140 mEq/L (ref 135–145)
Total Bilirubin: 0.4 mg/dL (ref 0.2–1.2)
Total Protein: 7 g/dL (ref 6.0–8.3)

## 2023-07-28 LAB — LIPID PANEL
Cholesterol: 202 mg/dL — ABNORMAL HIGH (ref 0–200)
HDL: 59.9 mg/dL (ref >39.00)
LDL Cholesterol: 123 mg/dL — ABNORMAL HIGH (ref 0–99)
NonHDL: 141.96
Total CHOL/HDL Ratio: 3
Triglycerides: 94 mg/dL (ref 0.0–149.0)
VLDL: 18.8 mg/dL (ref 0.0–40.0)

## 2023-07-28 LAB — HEMOGLOBIN A1C: Hgb A1c MFr Bld: 5.2 % (ref 4.6–6.5)

## 2023-07-28 LAB — VITAMIN D 25 HYDROXY (VIT D DEFICIENCY, FRACTURES): VITD: 30.82 ng/mL (ref 30.00–100.00)

## 2023-07-28 LAB — TSH: TSH: 2.07 u[IU]/mL (ref 0.35–5.50)

## 2023-07-29 LAB — IRON,TIBC AND FERRITIN PANEL
%SAT: 8 % — ABNORMAL LOW (ref 16–45)
Ferritin: 8 ng/mL — ABNORMAL LOW (ref 16–288)
Iron: 32 ug/dL — ABNORMAL LOW (ref 45–160)
TIBC: 406 ug/dL (ref 250–450)

## 2023-08-06 ENCOUNTER — Telehealth: Payer: Self-pay

## 2023-08-06 ENCOUNTER — Other Ambulatory Visit: Payer: Self-pay

## 2023-08-06 DIAGNOSIS — R790 Abnormal level of blood mineral: Secondary | ICD-10-CM

## 2023-08-06 DIAGNOSIS — I10 Essential (primary) hypertension: Secondary | ICD-10-CM

## 2023-08-06 NOTE — Telephone Encounter (Signed)
LVM to call back to schedule lab appt in 8 weeks orders are in already Seen by patient Brooke Harmon Tat on 08/01/2023  1:46 PM

## 2023-08-20 ENCOUNTER — Encounter: Payer: Self-pay | Admitting: Family

## 2023-08-20 ENCOUNTER — Encounter: Payer: Self-pay | Admitting: Adult Health

## 2023-08-20 ENCOUNTER — Telehealth: Payer: Self-pay

## 2023-08-20 ENCOUNTER — Ambulatory Visit: Payer: BC Managed Care – PPO | Admitting: Family

## 2023-08-20 VITALS — BP 118/78 | HR 96 | Temp 98.2°F | Ht 62.0 in | Wt 146.8 lb

## 2023-08-20 DIAGNOSIS — I10 Essential (primary) hypertension: Secondary | ICD-10-CM

## 2023-08-20 DIAGNOSIS — R053 Chronic cough: Secondary | ICD-10-CM

## 2023-08-20 DIAGNOSIS — Z23 Encounter for immunization: Secondary | ICD-10-CM

## 2023-08-20 DIAGNOSIS — F319 Bipolar disorder, unspecified: Secondary | ICD-10-CM

## 2023-08-20 MED ORDER — LOSARTAN POTASSIUM 100 MG PO TABS
100.0000 mg | ORAL_TABLET | Freq: Every day | ORAL | 3 refills | Status: DC
Start: 2023-08-20 — End: 2024-08-18

## 2023-08-20 NOTE — Assessment & Plan Note (Signed)
    08/20/2023    9:15 AM 07/09/2023    1:36 PM 10/29/2022    8:19 AM  Depression screen PHQ 2/9  Decreased Interest 1 0 0  Down, Depressed, Hopeless 1 0 0  PHQ - 2 Score 2 0 0  Altered sleeping 1 0   Tired, decreased energy 1 2   Change in appetite 0 0   Feeling bad or failure about yourself  2 0   Trouble concentrating 1 0   Moving slowly or fidgety/restless 1 0   Suicidal thoughts 1 0   PHQ-9 Score 9 2   Difficult doing work/chores Somewhat difficult Not difficult at all    Long discussion with patient in regards to my concern for depression, anxiety and suicidal thoughts.  She discussed thoughts are fleeting and she adamantly denies any suicidal plan.  She has a history of a suicide attempt.  I sent a secure chat to patient's psychiatric nurse practitioner, [name].  Per chart review, [name]'s office has reached out to patient and adjusted Wellbutrin.  I discussed with patient [facility] program for intensive therapy.  Patient does not feel that she needs that at this time.  I discussed with her securing gun in her home and asking a family member to remove the guns from her home.  Referral to counseling has been placed.

## 2023-08-20 NOTE — Telephone Encounter (Signed)
Patient is already taking 150 mg of Lamictal. She opts to increase Wellbutrin. Told her to just double and let us know when she needed a RF.

## 2023-08-20 NOTE — Assessment & Plan Note (Signed)
No relief from cessation of losartan.  Advised to start Claritin for couple weeks and then Pepcid AC if no resolution of cough.  Patient declines further evaluation including echocardiogram at this time.

## 2023-08-20 NOTE — Telephone Encounter (Signed)
I have asked admin to call, no appt has been scheduled yet.

## 2023-08-20 NOTE — Telephone Encounter (Signed)
Patient is reporting increased depression that she is unable to rate. She is ok with ADLs, is working. Denies racing thoughts, but does replay life choices and how they may impact others. No new stressors. She is getting 6-7 hours of sleep, but hit or miss if she can get to sleep and/or stay asleep.   She was last seen in June, FU scheduled currently for December. I told her I would ask admin to call and schedule an earlier appt.

## 2023-08-20 NOTE — Progress Notes (Unsigned)
Assessment & Plan:  Bipolar depression [facility]) Assessment & Plan:    08/20/2023    9:15 AM 07/09/2023    1:36 PM 10/29/2022    8:19 AM  Depression screen PHQ 2/9  Decreased Interest 1 0 0  Down, Depressed, Hopeless 1 0 0  PHQ - 2 Score 2 0 0  Altered sleeping 1 0   Tired, decreased energy 1 2   Change in appetite 0 0   Feeling bad or failure about yourself  2 0   Trouble concentrating 1 0   Moving slowly or fidgety/restless 1 0   Suicidal thoughts 1 0   PHQ-9 Score 9 2   Difficult doing work/chores Somewhat difficult Not difficult at all    Long discussion with patient in regards to my concern for depression, anxiety and suicidal thoughts.  She discussed thoughts are fleeting and she adamantly denies any suicidal plan.  She has a history of a suicide attempt.  I sent a secure chat to patient's psychiatric nurse practitioner, Brooke Harmon.  Per chart review, Brooke Harmon's office has reached out to patient and adjusted Wellbutrin.  I discussed with patient Brooke Harmon program for intensive therapy.  Patient does not feel that she needs that at this time.  I discussed with her securing gun in her home and asking a family member to remove the guns from her home.  Referral to counseling has been placed.  Orders: -     Ambulatory referral to Psychology  Essential hypertension Assessment & Plan: No improvement with cough with trial stop of losartan.  She is no longer on amlodipine . patient has resumed losartan 100 mg daily.  She will continue hydrochlorothiazide 25 mg daily  Orders: -     Losartan Potassium; Take 1 tablet (100 mg total) by mouth daily.  Dispense: 90 tablet; Refill: 3  Need for influenza vaccination -     Flu vaccine trivalent PF, 6mos and older(Flulaval,Afluria,Fluarix,Fluzone)  Chronic cough Assessment & Plan: No relief from cessation of losartan.  Advised to start Claritin for couple weeks and then Pepcid AC if no resolution of cough.  Patient declines further evaluation  including echocardiogram at this time.      Return precautions given.   Risks, benefits, and alternatives of the medications and treatment plan prescribed today were discussed, and patient expressed understanding.   Education regarding symptom management and diagnosis given to patient on AVS either electronically or printed.  Return in about 6 weeks (around 10/01/2023).  Brooke Plowman, FNP  Subjective:    Patient ID: Brooke Harmon, female    DOB: 1961/04/24, 62 y.o.   MRN: [id number]  CC: Brooke Harmon is a 62 y.o. female who presents today for follow up.   HPI: She continues to have dry cough throughout the day She feels she is trying to get 'the mucous out'. Cough can we worse when laying down , in the morning or after a meal. No associated CP, SOB, wheezing, leg swelling, orthopnea.       Trial stop of losartan without improvement to call.  Patient has since resumed amlodipine   She endorses increased anxiety and depression.  She feels at times she is letting her colleagues down at work.  She describes having "good days and bad days".Compliant Lamictal 150 mg daily, Wellbutrin 150 mg every morning  Following with psychiatry Brooke Harmon , last seen 05/06/23  H/o suicide attempt 2014 .  She has never been hospitalized for her mental health.  She describes being aware of suicide whether it is reading in the news or having a fleeting thought.  She denies any active plan to hurt herself, commit suicide or harm anyone else  There are days in which she has interruption in sleep, overall sleeping well.   Denies a period of having more energy than usual.  Her husband has a gun in home and stored in gun safe; she doesn't have access to guns or key.   Confident in both her brother Brooke Hua and daughter Brooke Harmon.   She lives for her children    Allergies: Lexapro  [escitalopram oxalate] and Lisinopril Current Outpatient Medications on File Prior to Visit   Medication Sig Dispense Refill   buPROPion (WELLBUTRIN XL) 150 MG 24 hr tablet Take 1 tablet (150 mg total) by mouth daily. 90 tablet 1   Cholecalciferol (VITAMIN D3) 20 MCG (800 UNIT) TABS Take 1 capsule by mouth daily.     ferrous sulfate 325 (65 FE) MG tablet Take 325 mg by mouth daily with breakfast.     hydrochlorothiazide (HYDRODIURIL) 25 MG tablet Take 1 tablet (25 mg total) by mouth daily. 90 tablet 3   Ivermectin 1 % CREA Apply 1 application topically daily as needed.     lamoTRIgine (LAMICTAL) 150 MG tablet Take 2 tablets (300 mg total) by mouth daily. 180 tablet 1   pimecrolimus (ELIDEL) 1 % cream Apply topically 2 (two) times daily.     potassium chloride (KLOR-CON M10) 10 MEQ tablet TAKE 1 TABLET (10 MEQ TOTAL) BY MOUTH EVERY OTHER DAY. 45 tablet 3   triamcinolone cream (KENALOG) 0.1 % SMARTSIG:1 Application Topical 2-3 Times Daily     Azelastine HCl 137 MCG/SPRAY SOLN PLACE 1 SPRAY INTO BOTH NOSTRILS 2 (TWO) TIMES DAILY. USE IN EACH NOSTRIL AS DIRECTED (Patient not taking: Reported on 08/20/2023) 30 mL 1   No current facility-administered medications on file prior to visit.    Review of Systems  Constitutional:  Negative for chills and fever.  Respiratory:  Negative for cough.   Cardiovascular:  Negative for chest pain and palpitations.  Gastrointestinal:  Negative for nausea and vomiting.  Psychiatric/Behavioral:  Negative for suicidal ideas. The patient is nervous/anxious.       Objective:    BP 118/78   Pulse 96   Temp 98.2 F (36.8 C) (Oral)   Ht 5\' 2"  (1.575 m)   Wt 146 lb 12.8 oz (66.6 kg)   SpO2 97%   BMI 26.85 kg/m  BP Readings from Last 3 Encounters:  08/20/23 118/78  07/09/23 120/70  10/29/22 126/76   Wt Readings from Last 3 Encounters:  08/20/23 146 lb 12.8 oz (66.6 kg)  07/09/23 146 lb 3.2 oz (66.3 kg)  10/29/22 154 lb 9.6 oz (70.1 kg)    Physical Exam Vitals reviewed.  Constitutional:      Appearance: She is well-developed.  Eyes:      Conjunctiva/sclera: Conjunctivae normal.  Cardiovascular:     Rate and Rhythm: Normal rate and regular rhythm.     Pulses: Normal pulses.     Heart sounds: Normal heart sounds.  Pulmonary:     Effort: Pulmonary effort is normal.     Breath sounds: Normal breath sounds. No wheezing, rhonchi or rales.  Skin:    General: Skin is warm and dry.  Neurological:     Mental Status: She is alert.  Psychiatric:        Speech: Speech normal.        Behavior:  Behavior normal.        Thought Content: Thought content normal.

## 2023-08-20 NOTE — Assessment & Plan Note (Signed)
No improvement with cough with trial stop of losartan.  She is no longer on amlodipine . patient has resumed losartan 100 mg daily.  She will continue hydrochlorothiazide 25 mg daily

## 2023-08-20 NOTE — Patient Instructions (Addendum)
Trial of claritin 10mg  daily   Trial of pepcid ac over the counter at night for reflux aggravating symptoms.   Referral to counseling.   Let us know if you dont hear back within a week in regards to an appointment being scheduled.   So that you are aware, if you are [facility] MyChart user , please pay attention to your MyChart messages as you may receive a MyChart message with a phone number to call and schedule this test/appointment own your own from our referral coordinator. This is a new process so I do not want you to miss this message.  If you are not a MyChart user, you will receive a phone call.    Our hope is for gradual improvement of mood since starting medication; however this may take several weeks.   If you start to have unusual thoughts, thoughts of hurting yourself, or anyone else, please go immediately to the emergency department.   Follow up in 6 weeks.   Please text to [phone] and write the word 'home'. This will put you in touch with trained crisis counselor and resources.    National Suicide Prevention Hotline - available 24 hours a day, 7 days a week.  [phone]  Major Depressive Disorder Major depressive disorder is a mental illness. It also may be called clinical depression or unipolar depression. Major depressive disorder usually causes feelings of sadness, hopelessness, or helplessness. Some people with this disorder do not feel particularly sad but lose interest in doing things they used to enjoy (anhedonia). Major depressive disorder also can cause physical symptoms. It can interfere with work, school, relationships, and other normal everyday activities. The disorder varies in severity but is longer lasting and more serious than the sadness we all feel from time to time in our lives. Major depressive disorder often is triggered by stressful life events or major life changes. Examples of these triggers include divorce, loss of your job or home, a move, and the death  of a family member or close friend. Sometimes this disorder occurs for no obvious reason at all. People who have family members with major depressive disorder or bipolar disorder are at higher risk for developing this disorder, with or without life stressors. Major depressive disorder can occur at any age. It may occur just once in your life (single episode major depressive disorder). It may occur multiple times (recurrent major depressive disorder). SYMPTOMS People with major depressive disorder have either anhedonia or depressed mood on nearly a daily basis for at least 2 weeks or longer. Symptoms of depressed mood include: Feelings of sadness (blue or down in the dumps) or emptiness. Feelings of hopelessness or helplessness. Tearfulness or episodes of crying (may be observed by others). Irritability (children and adolescents). In addition to depressed mood or anhedonia or both, people with this disorder have at least four of the following symptoms: Difficulty sleeping or sleeping too much.   Significant change (increase or decrease) in appetite or weight.   Lack of energy or motivation. Feelings of guilt and worthlessness.   Difficulty concentrating, remembering, or making decisions. Unusually slow movement (psychomotor retardation) or restlessness (as observed by others).   Recurrent wishes for death, recurrent thoughts of self-harm (suicide), or a suicide attempt. People with major depressive disorder commonly have persistent negative thoughts about themselves, other people, and the world. People with severe major depressive disorder may experience distorted beliefs or perceptions about the world (psychotic delusions). They also may see or hear things that are  not real (psychotic hallucinations). DIAGNOSIS Major depressive disorder is diagnosed through an assessment by your health care provider. Your health care provider will ask about aspects of your daily life, such as mood, sleep, and  appetite, to see if you have the diagnostic symptoms of major depressive disorder. Your health care provider may ask about your medical history and use of alcohol or drugs, including prescription medicines. Your health care provider also may do a physical exam and blood work. This is because certain medical conditions and the use of certain substances can cause major depressive disorder-like symptoms (secondary depression). Your health care provider also may refer you to a mental health specialist for further evaluation and treatment. TREATMENT It is important to recognize the symptoms of major depressive disorder and seek treatment. The following treatments can be prescribed for this disorder:   Medicine. Antidepressant medicines usually are prescribed. Antidepressant medicines are thought to correct chemical imbalances in the brain that are commonly associated with major depressive disorder. Other types of medicine may be added if the symptoms do not respond to antidepressant medicines alone or if psychotic delusions or hallucinations occur. Talk therapy. Talk therapy can be helpful in treating major depressive disorder by providing support, education, and guidance. Certain types of talk therapy also can help with negative thinking (cognitive behavioral therapy) and with relationship issues that trigger this disorder (interpersonal therapy). A mental health specialist can help determine which treatment is best for you. Most people with major depressive disorder do well with a combination of medicine and talk therapy. Treatments involving electrical stimulation of the brain can be used in situations with extremely severe symptoms or when medicine and talk therapy do not work over time. These treatments include electroconvulsive therapy, transcranial magnetic stimulation, and vagal nerve stimulation.   This information is not intended to replace advice given to you by your health care provider. Make sure you  discuss any questions you have with your health care provider.   Document Released: 03/15/2013 Document Revised: 12/09/2014 Document Reviewed: 03/15/2013 Elsevier Interactive Patient Education [employer]

## 2023-08-20 NOTE — Telephone Encounter (Signed)
Please call and schedule an earlier appt with Almira Coaster.

## 2023-09-23 ENCOUNTER — Ambulatory Visit (INDEPENDENT_AMBULATORY_CARE_PROVIDER_SITE_OTHER): Payer: BC Managed Care – PPO | Admitting: Behavioral Health

## 2023-09-23 DIAGNOSIS — F331 Major depressive disorder, recurrent, moderate: Secondary | ICD-10-CM

## 2023-09-23 DIAGNOSIS — F39 Unspecified mood [affective] disorder: Secondary | ICD-10-CM | POA: Diagnosis not present

## 2023-09-23 DIAGNOSIS — F411 Generalized anxiety disorder: Secondary | ICD-10-CM | POA: Diagnosis not present

## 2023-09-23 NOTE — Progress Notes (Signed)
                Brooke Jewell L Farryn Linares, LMFT 

## 2023-09-23 NOTE — Progress Notes (Signed)
[facility] Behavioral Health Counselor Initial Adult Exam  Name: Brooke Harmon Date: 09/23/2023 MRN: [id number] DOB: 1961/05/08 PCP: Allegra Grana, FNP  Time spent: 60 min NuPt Intake/Assessment In Person @ [facility] Office Time In: 1:00pm Time Out: 2:00pm  Guardian/Payee:  [employer]    Paperwork requested: No   Reason for Visit /Presenting Problem: Elevated levels of self-loathing today & seeking an unbiased, objective ear for psychotherapy. Inc in levels of anx/dep & stress.  Mental Status Exam: Appearance:   Casual     Behavior:  Appropriate and Sharing  Motor:  Normal  Speech/Language:   Clear and Coherent and Normal Rate  Affect:  Appropriate  Mood:  normal  Thought process:  normal  Thought content:    WNL  Sensory/Perceptual disturbances:    WNL  Orientation:  oriented to person, place, time/date, and situation  Attention:  Good  Concentration:  Good  Memory:  WNL  Fund of knowledge:   Good  Insight:    Fair  Judgment:   Fair  Impulse Control:  Good    Risk Assessment: Danger to Self:  No Self-injurious Behavior: No Danger to Others: No Duty to Warn:no Physical Aggression / Violence:No  Access to Firearms a concern: No  Gang Involvement:No  Patient / guardian was educated about steps to take if suicide or homicide risk level increases between visits: yes; appropriate to ICD process While future psychiatric events cannot be accurately predicted, the patient does not currently require acute inpatient psychiatric care and does not currently meet [facility] involuntary commitment criteria.  Substance Abuse History: Current substance abuse: No     Past Psychiatric History:   Previous psychological history is significant for anxiety, depression, and mood disorder Outpatient Providers: Rennie Plowman, FNP History of Psych Hospitalization: No  Psychological Testing:  NA    Abuse History:  Victim of: No.,  NA    Report needed:  No. Victim of Neglect:No. Perpetrator of  NA   Witness / Exposure to Domestic Violence: No   Protective Services Involvement: No  Witness to [employer] Violence:  No   Family History:  Family History  Problem Relation Age of Onset   Dementia Mother    Anxiety disorder Mother    Depression Mother    Hypertension Mother    Mental illness Mother    Cancer Father        prostate   Hypertension Father    Mental illness Father    Diabetes Father    Depression Maternal Grandmother    Anxiety disorder Maternal Grandmother     Living situation: the patient lives with their family  Sexual Orientation: Straight  Relationship Status: married  Name of spouse / other: [name] If a parent, number of children / ages:30yo Dtr [name] who lives @ home & works @ [employer] & 31yo Son Renae Fickle who lives in [place], [place] & works for the [employer].  Support Systems: Family, esp'ly Dtr  Financial Stress:  No   Income/Employment/Disability: Employment as Event organiser for [employer] Service: No   Educational History: Education: college graduate  Religion/Sprituality/World View: Stopped attending Church during [employer] & never returned; wants to find a [employer] not too hopeful.  Any cultural differences that may affect / interfere with treatment:  None noted today  Recreation/Hobbies: reading & researching different topics  Stressors: Marital or family conflict  , does not like to do presentations for work, does not like self-very critical &  self-loathing, reduced amt of friendships by her own dec, & Husb is not supportive. Pt is an overthinker & needs improved boundaries.  Strengths: Supportive Relationships, Family, Church, Journalist, newspaper, and Able to Communicate Effectively  Barriers:  None noted today   Legal History: Pending legal issue / charges: The patient has no significant history of legal issues. History of legal issue / charges:  NA  Medical History/Surgical  History: reviewed Past Medical History:  Diagnosis Date   Chicken pox    Depression    Hypertension    Migraines    Psoriasis     Past Surgical History:  Procedure Laterality Date   CESAREAN SECTION      Medications: Current Outpatient Medications  Medication Sig Dispense Refill   Azelastine HCl 137 MCG/SPRAY SOLN PLACE 1 SPRAY INTO BOTH NOSTRILS 2 (TWO) TIMES DAILY. USE IN EACH NOSTRIL AS DIRECTED (Patient not taking: Reported on 08/20/2023) 30 mL 1   buPROPion (WELLBUTRIN XL) 150 MG 24 hr tablet Take 1 tablet (150 mg total) by mouth daily. 90 tablet 1   Cholecalciferol (VITAMIN D3) 20 MCG (800 UNIT) TABS Take 1 capsule by mouth daily.     ferrous sulfate 325 (65 FE) MG tablet Take 325 mg by mouth daily with breakfast.     hydrochlorothiazide (HYDRODIURIL) 25 MG tablet Take 1 tablet (25 mg total) by mouth daily. 90 tablet 3   Ivermectin 1 % CREA Apply 1 application topically daily as needed.     lamoTRIgine (LAMICTAL) 150 MG tablet Take 2 tablets (300 mg total) by mouth daily. 180 tablet 1   losartan (COZAAR) 100 MG tablet Take 1 tablet (100 mg total) by mouth daily. 90 tablet 3   pimecrolimus (ELIDEL) 1 % cream Apply topically 2 (two) times daily.     potassium chloride (KLOR-CON M10) 10 MEQ tablet TAKE 1 TABLET (10 MEQ TOTAL) BY MOUTH EVERY OTHER DAY. 45 tablet 3   triamcinolone cream (KENALOG) 0.1 % SMARTSIG:1 Application Topical 2-3 Times Daily     No current facility-administered medications for this visit.    Allergies  Allergen Reactions   Lexapro  [Escitalopram Oxalate] Other (See Comments)    Pt reports she had sleeplessness, delusional thinking and self cutting   Lisinopril     cough    Diagnoses:  Major depressive disorder, recurrent episode, moderate (HCC)  Unspecified mood (affective) disorder (HCC)  Generalized anxiety disorder  Plan of Care: Brooke Harmon will attend all sessions as scheduled every 2-3 wks. She hopes to reduce her neg thinking patterns, inc  her motivation & f/u on initiation of tasks. She will take some small steps to jumpstart this process & report back @ our next session. This has been a lifelong pattern of thinking & she wishes to stop this habit.   [employer] Date: 11/01/2023  Progress: 3  Frequency: Once every 2-3 wks  Modality: Claretta Fraise, LMFT

## 2023-09-28 ENCOUNTER — Other Ambulatory Visit: Payer: Self-pay | Admitting: Adult Health

## 2023-10-02 ENCOUNTER — Other Ambulatory Visit (INDEPENDENT_AMBULATORY_CARE_PROVIDER_SITE_OTHER): Payer: BC Managed Care – PPO

## 2023-10-02 DIAGNOSIS — I10 Essential (primary) hypertension: Secondary | ICD-10-CM | POA: Diagnosis not present

## 2023-10-02 DIAGNOSIS — R790 Abnormal level of blood mineral: Secondary | ICD-10-CM

## 2023-10-02 LAB — CBC WITH DIFFERENTIAL/PLATELET
Basophils Absolute: 0.1 10*3/uL (ref 0.0–0.1)
Basophils Relative: 0.7 % (ref 0.0–3.0)
Eosinophils Absolute: 0.2 10*3/uL (ref 0.0–0.7)
Eosinophils Relative: 2.5 % (ref 0.0–5.0)
HCT: 43.8 % (ref 36.0–46.0)
Hemoglobin: 14.1 g/dL (ref 12.0–15.0)
Lymphocytes Relative: 18 % (ref 12.0–46.0)
Lymphs Abs: 1.4 10*3/uL (ref 0.7–4.0)
MCHC: 32.3 g/dL (ref 30.0–36.0)
MCV: 91.1 fL (ref 78.0–100.0)
Monocytes Absolute: 0.8 10*3/uL (ref 0.1–1.0)
Monocytes Relative: 10.3 % (ref 3.0–12.0)
Neutro Abs: 5.4 10*3/uL (ref 1.4–7.7)
Neutrophils Relative %: 68.5 % (ref 43.0–77.0)
Platelets: 396 10*3/uL (ref 150.0–400.0)
RBC: 4.8 Mil/uL (ref 3.87–5.11)
RDW: 13.6 % (ref 11.5–15.5)
WBC: 7.9 10*3/uL (ref 4.0–10.5)

## 2023-10-03 LAB — IRON,TIBC AND FERRITIN PANEL
%SAT: 20 % (ref 16–45)
Ferritin: 14 ng/mL — ABNORMAL LOW (ref 16–288)
Iron: 71 ug/dL (ref 45–160)
TIBC: 355 ug/dL (ref 250–450)

## 2023-10-06 ENCOUNTER — Telehealth: Payer: Self-pay | Admitting: Adult Health

## 2023-10-06 MED ORDER — BUPROPION HCL ER (XL) 300 MG PO TB24: 300.0000 mg | ORAL_TABLET | Freq: Every day | ORAL | 1 refills | Status: DC

## 2023-10-06 NOTE — Telephone Encounter (Signed)
Sent!

## 2023-10-06 NOTE — Telephone Encounter (Signed)
Pt Brooke Harmon @ 6:18a requesting refill of Buproprion.  Pt states at last visit with Almira Coaster, she was told to start taking 2 pills a day instead of 1, therefore she needs her refill now.  She said she is out of meds.  Next appt 12/4

## 2023-10-15 ENCOUNTER — Ambulatory Visit: Payer: BC Managed Care – PPO | Admitting: Behavioral Health

## 2023-10-19 ENCOUNTER — Other Ambulatory Visit: Payer: Self-pay | Admitting: Family

## 2023-10-19 DIAGNOSIS — I10 Essential (primary) hypertension: Secondary | ICD-10-CM

## 2023-10-28 ENCOUNTER — Other Ambulatory Visit: Payer: Self-pay | Admitting: Adult Health

## 2023-11-01 ENCOUNTER — Other Ambulatory Visit: Payer: Self-pay | Admitting: Adult Health

## 2023-11-01 DIAGNOSIS — F39 Unspecified mood [affective] disorder: Secondary | ICD-10-CM

## 2023-11-01 DIAGNOSIS — F331 Major depressive disorder, recurrent, moderate: Secondary | ICD-10-CM

## 2023-11-03 ENCOUNTER — Ambulatory Visit: Payer: BC Managed Care – PPO | Admitting: Behavioral Health

## 2023-11-03 NOTE — Telephone Encounter (Signed)
Has an appt 12/4

## 2023-11-05 ENCOUNTER — Ambulatory Visit (INDEPENDENT_AMBULATORY_CARE_PROVIDER_SITE_OTHER): Payer: Self-pay | Admitting: Adult Health

## 2023-11-05 ENCOUNTER — Encounter: Payer: Self-pay | Admitting: Adult Health

## 2023-11-05 DIAGNOSIS — F411 Generalized anxiety disorder: Secondary | ICD-10-CM

## 2023-11-05 DIAGNOSIS — F331 Major depressive disorder, recurrent, moderate: Secondary | ICD-10-CM | POA: Diagnosis not present

## 2023-11-05 MED ORDER — BUPROPION HCL ER (XL) 300 MG PO TB24: 300.0000 mg | ORAL_TABLET | Freq: Every day | ORAL | 1 refills | Status: DC

## 2023-11-05 NOTE — Progress Notes (Signed)
Brooke Harmon [id number] May 05, 1961 62 y.o.  Subjective:   Patient ID:  Brooke Harmon is a 62 y.o. (DOB [birth year]) female.  Chief Complaint: No chief complaint on file.   HPI Brooke Harmon presents to the office today for follow-up of MDD and GAD.  Describes mood today as "ok". Pleasant. Denies tearfulness. Mood symptoms - denies depression and irritability. Reports anxiety. Denies panic attacks. Reports worry, rumination, and over thinking. Denies obsessive thoughts and acts. Mood is stable. Stating "I fel like I'm doing alright. Feels like the increase in Wellbutrin to 300mg  daily has been helpful. Feels like the Lamictal continues to work well. Stable interest and motivation. Taking medications as prescribed.  Energy levels stable. Active, does not have a regular exercise routine. Walking some days. Enjoys some usual interests and activities. Married. Lives with husband and daughter. Son lives in [place], [place]. Spending time with family. Appetite adequate. Weight stable. Sleeps well most nights. Averages 7 hours. Focus and concentration improved. Completing tasks. Managing aspects of household. Works full-time as a Designer, multimedia. Denies SI or HI.  Denies AH or VH. Denies self harm.  Denies substance use. Denies alcohol use.  Previous medication trials:  Carbamazepine Depakote Artane Lexapro - SI Latuda Zoloft Serzone Lamictal Xanax   GAD-7    Flowsheet Row Office Visit from 08/20/2023 in [facility] [employer] Visit from 07/09/2023 in [facility] [employer] [facility]  Total GAD-7 Score 7 0      PHQ2-9    Flowsheet Row Office Visit from 08/20/2023 in [facility] Visit from 07/09/2023 in [facility] [employer] Visit from 10/29/2022 in [facility] [employer] Visit from  09/02/2022 in [facility] Office Visit from 09/23/2018 in [facility] Total Score 2 0 0 3 0  PHQ-9 Total Score 9 2 -- 10 0        Review of Systems:  Review of Systems  Musculoskeletal:  Negative for gait problem.  Neurological:  Negative for tremors.  Psychiatric/Behavioral:         Please refer to HPI    Medications: I have reviewed the patient's current medications.  Current Outpatient Medications  Medication Sig Dispense Refill   Azelastine HCl 137 MCG/SPRAY SOLN PLACE 1 SPRAY INTO BOTH NOSTRILS 2 (TWO) TIMES DAILY. USE IN EACH NOSTRIL AS DIRECTED (Patient not taking: Reported on 08/20/2023) 30 mL 1   buPROPion (WELLBUTRIN XL) 300 MG 24 hr tablet Take 1 tablet (300 mg total) by mouth daily. 90 tablet 1   Cholecalciferol (VITAMIN D3) 20 MCG (800 UNIT) TABS Take 1 capsule by mouth daily.     ferrous sulfate 325 (65 FE) MG tablet Take 325 mg by mouth daily with breakfast.     hydrochlorothiazide (HYDRODIURIL) 25 MG tablet Take 1 tablet (25 mg total) by mouth daily. 90 tablet 3   Ivermectin 1 % CREA Apply 1 application topically daily as needed.     lamoTRIgine (LAMICTAL) 150 MG tablet TAKE 2 TABLETS BY MOUTH DAILY. 180 tablet 1   losartan (COZAAR) 100 MG tablet Take 1 tablet (100 mg total) by mouth daily. 90 tablet 3   pimecrolimus (ELIDEL) 1 % cream Apply topically 2 (two) times daily.     potassium chloride (KLOR-CON M10) 10 MEQ tablet TAKE 1 TABLET (10 MEQ TOTAL) BY MOUTH EVERY OTHER DAY.  45 tablet 3   triamcinolone cream (KENALOG) 0.1 % SMARTSIG:1 Application Topical 2-3 Times Daily     No current facility-administered medications for this visit.    Medication Side Effects: None  Allergies:  Allergies  Allergen Reactions   Lexapro  [Escitalopram Oxalate] Other (See Comments)    Pt reports she had sleeplessness, delusional thinking and self cutting   Lisinopril     cough    Past Medical  History:  Diagnosis Date   Chicken pox    Depression    Hypertension    Migraines    Psoriasis     Past Medical History, Surgical history, Social history, and Family history were reviewed and updated as appropriate.   Please see review of systems for further details on the patient's review from today.   Objective:   Physical Exam:  There were no vitals taken for this visit.  Physical Exam Constitutional:      General: She is not in acute distress. Musculoskeletal:        General: No deformity.  Neurological:     Mental Status: She is alert and oriented to person, place, and time.     Coordination: Coordination normal.  Psychiatric:        Attention and Perception: Attention and perception normal. She does not perceive auditory or visual hallucinations.        Mood and Affect: Mood normal. Mood is not anxious or depressed. Affect is not labile, blunt, angry or inappropriate.        Speech: Speech normal.        Behavior: Behavior normal.        Thought Content: Thought content normal. Thought content is not paranoid or delusional. Thought content does not include homicidal or suicidal ideation. Thought content does not include homicidal or suicidal plan.        Cognition and Memory: Cognition and memory normal.        Judgment: Judgment normal.     Comments: Insight intact     Lab Review:     Component Value Date/Time   NA 140 07/28/2023 0848   K 3.9 07/28/2023 0848   CL 101 07/28/2023 0848   CO2 30 07/28/2023 0848   GLUCOSE 100 (H) 07/28/2023 0848   BUN 16 07/28/2023 0848   CREATININE 0.89 07/28/2023 0848   CALCIUM 9.4 07/28/2023 0848   PROT 7.0 07/28/2023 0848   ALBUMIN 4.3 07/28/2023 0848   AST 16 07/28/2023 0848   ALT 21 07/28/2023 0848   ALKPHOS 89 07/28/2023 0848   BILITOT 0.4 07/28/2023 0848       Component Value Date/Time   WBC 7.9 10/02/2023 0742   RBC 4.80 10/02/2023 0742   HGB 14.1 10/02/2023 0742   HCT 43.8 10/02/2023 0742   PLT 396.0  10/02/2023 0742   MCV 91.1 10/02/2023 0742   MCH 27.4 12/06/2019 1129   MCHC 32.3 10/02/2023 0742   RDW 13.6 10/02/2023 0742   LYMPHSABS 1.4 10/02/2023 0742   MONOABS 0.8 10/02/2023 0742   EOSABS 0.2 10/02/2023 0742   BASOSABS 0.1 10/02/2023 0742    No results found for: "POCLITH", "LITHIUM"   No results found for: "PHENYTOIN", "PHENOBARB", "VALPROATE", "CBMZ"   .res Assessment: Plan:    Plan:  PDMP reviewed  Lamictal 300mg  daily Wellbutrin XL 300omg every morning  RTC 6 months  Patient advised to contact office with any questions, adverse effects, or acute worsening in signs and symptoms.   Counseled patient regarding potential benefits, risks, and  side effects of Lamictal to include potential risk of Stevens-Johnson syndrome. Advised patient to stop taking Lamictal and contact office immediately if rash develops and to seek urgent medical attention if rash is severe and/or spreading quickly.    Diagnoses and all orders for this visit:  Major depressive disorder, recurrent episode, moderate (HCC)  Generalized anxiety disorder  Other orders -     buPROPion (WELLBUTRIN XL) 300 MG 24 hr tablet; Take 1 tablet (300 mg total) by mouth daily.     Please see After Visit Summary for patient specific instructions.  Future Appointments  Date Time Provider Department Center  11/20/2023 11:00 AM Winstead, Lawana Chambers, LMFT [facility] None    No orders of the defined types were placed in this encounter.   -------------------------------

## 2023-11-20 ENCOUNTER — Ambulatory Visit: Payer: BC Managed Care – PPO | Admitting: Behavioral Health

## 2023-11-20 DIAGNOSIS — F331 Major depressive disorder, recurrent, moderate: Secondary | ICD-10-CM

## 2023-11-20 DIAGNOSIS — F411 Generalized anxiety disorder: Secondary | ICD-10-CM

## 2023-11-20 DIAGNOSIS — F39 Unspecified mood [affective] disorder: Secondary | ICD-10-CM | POA: Diagnosis not present

## 2023-11-20 NOTE — Progress Notes (Signed)
[facility] Counselor/Therapist Progress Note  Patient ID: Brooke Harmon, MRN: [id number],    Date: 11/20/2023  Time Spent: 55 min Caregility video; Pt is in her Office @ [address] on the [employer] is working remotely from [employer]. Pt is aware of the risks/limitations of telehealth & consents to Tx today.  Time In: 11:00am Time Out: 11:55am   Treatment Type: Individual Therapy  Reported Symptoms: Elevated anx/dep & stress over the Holidays  Mental Status Exam: Appearance:  Casual     Behavior: Appropriate and Sharing  Motor: Normal  Speech/Language:  Clear and Coherent  Affect: Appropriate  Mood: normal  Thought process: normal  Thought content:   WNL  Sensory/Perceptual disturbances:   WNL  Orientation: oriented to person, place, time/date, and situation  Attention: Good  Concentration: Good  Memory: WNL  Fund of knowledge:  Good  Insight:   Good  Judgment:  Good  Impulse Control: Good   Risk Assessment: Danger to Self:  No Self-injurious Behavior: No Danger to Others: No Duty to Warn:no Physical Aggression / Violence:No  Access to Firearms a concern: No  Gang Involvement:No   Subjective: Pt is unfamiliar w/setting boundaries/others. She wants more acumen w/this skill.   Interventions: Family Systems  Diagnosis:Major depressive disorder, recurrent episode, moderate (HCC)  Generalized anxiety disorder  Unspecified mood (affective) disorder (HCC)  Plan: Alyze will set/estb & keep her own personal boundaries that serve her best. She will practice this & report back next session about her progress.  Target Date: 12/17/2023  Progress: 4  Frequency: Once every 2-3 wks  Modality: Claretta Fraise, LMFT

## 2023-11-20 NOTE — Progress Notes (Signed)
                Anastasya Jewell L Farryn Linares, LMFT 

## 2023-11-26 ENCOUNTER — Other Ambulatory Visit: Payer: Self-pay | Admitting: Family

## 2023-11-26 DIAGNOSIS — I10 Essential (primary) hypertension: Secondary | ICD-10-CM

## 2023-12-18 ENCOUNTER — Ambulatory Visit: Payer: Self-pay | Admitting: Behavioral Health

## 2023-12-18 DIAGNOSIS — F411 Generalized anxiety disorder: Secondary | ICD-10-CM | POA: Diagnosis not present

## 2023-12-18 DIAGNOSIS — F331 Major depressive disorder, recurrent, moderate: Secondary | ICD-10-CM

## 2023-12-18 NOTE — Progress Notes (Signed)
                Anastasya Jewell L Farryn Linares, LMFT 

## 2023-12-18 NOTE — Progress Notes (Addendum)
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 Pt is aware of the risks/limitations of telehealth & consents to Tx today.  Time In: 11:00am Time Out: 11:55am  Treatment Type: Individual Therapy  Reported Symptoms: Reduction in anx/dep & stress since the Holiday festivities have subsided.   Mental Status Exam: Appearance:  Casual     Behavior: Appropriate and Sharing  Motor: Normal  Speech/Language:  Clear and Coherent  Affect: Appropriate  Mood: normal  Thought process: normal  Thought content:   WNL  Sensory/Perceptual disturbances:   WNL  Orientation: oriented to person, place, time/date, and situation  Attention: Good  Concentration: Good  Memory: WNL  Fund of knowledge:  Good  Insight:   Good  Judgment:  Good  Impulse Control: Good   Risk Assessment: Danger to Self:  No Self-injurious Behavior: No Danger to Others: No Duty to Warn:no Physical Aggression / Violence:No  Access to Firearms a concern: No  Gang Involvement:No   Subjective: Pt is reporting her Husb's trip to [place] to caregive for his Dad who is 97yo. He will be gone for a month. She is glad he is caring for his Dad. Pt sts her Dtr & herself do better when he is gone from home. Dtr & Dad do not hold a positive connection. Pt has considered her marriage & requested divorce twice in the past. If they were not married, they would not be friends. This causes her sadness. Brooke Harmon has hearing loss & this further impairs the relationships btwn him, Pt & her Dtr.   Conversations are difficult w/Husb & if mental health issues arise, he ignores or does not address the issues. This has provided new understandings for herself. They do not help ea other much. She has to schedule time w/him to  accomplish tasks.   Work is going well, even with the renovations that will take a year & a half. Her work setting may permanently change in the future.   Interventions: Family Systems  Diagnosis:Major depressive disorder, recurrent episode, moderate (HCC)  Generalized anxiety disorder  Plan: [name] c/o less time w/her Husb & has made efforts to schedule time w/him, even a fun activity. She has not felt like affection, closeness, or inc'd emot'l communication w/him. She is making efforts to gain the closeness, but this situation has existed for yrs. She will encourage Husb to wear his hearing Aides more often. She will be firm about her need for him to follow this advice for his own Cognitive abilities & prevent decline.   Target Date: 01/17/2024  Progress: 5  Frequency: Once every 2-3 wks  Modality: Claretta Fraise, LMFT

## 2024-01-27 ENCOUNTER — Other Ambulatory Visit: Payer: Self-pay | Admitting: Adult Health

## 2024-05-19 ENCOUNTER — Ambulatory Visit (INDEPENDENT_AMBULATORY_CARE_PROVIDER_SITE_OTHER): Payer: Self-pay | Admitting: Adult Health

## 2024-05-19 ENCOUNTER — Encounter: Payer: Self-pay | Admitting: Adult Health

## 2024-05-19 DIAGNOSIS — F331 Major depressive disorder, recurrent, moderate: Secondary | ICD-10-CM

## 2024-05-19 DIAGNOSIS — F39 Unspecified mood [affective] disorder: Secondary | ICD-10-CM

## 2024-05-19 MED ORDER — BUPROPION HCL ER (XL) 300 MG PO TB24: 300.0000 mg | ORAL_TABLET | Freq: Every day | ORAL | 1 refills | Status: DC

## 2024-05-19 MED ORDER — LAMOTRIGINE 150 MG PO TABS: 300.0000 mg | ORAL_TABLET | Freq: Every day | ORAL | 1 refills | Status: DC

## 2024-05-19 NOTE — Progress Notes (Signed)
 Brooke Harmon [id number] [birth year] 63 y.o.  Subjective:   Patient ID:  Brooke Harmon is a 63 y.o. (DOB 06-May-1961) female.  Chief Complaint: No chief complaint on file.   HPI Brooke Harmon presents to the office today for follow-up of MDD and GAD.  Describes mood today as ok. Pleasant. Denies tearfulness. Mood symptoms - denies depression and irritability. Reports stable interest and motivation.Reports some anxiety. Denies panic attacks. Reports some worry, rumination, and over thinking. Denies obsessive thoughts and acts. Mood is stable. Stating I fel like I'm doing ok. Feels like current medication regimen works well. Taking medications as prescribed.  Energy levels stable. Active, does not have a regular exercise routine. Walking some days. Enjoys some usual interests and activities. Married. Lives with husband and daughter. Son lives in [place], [place]. Spending time with family. Appetite adequate. Weight stable. Sleeps well most nights. Averages 6 to 7 hours. Focus and concentration improved. Completing tasks. Managing aspects of household. Works full-time as a Designer, multimedia. Denies SI or HI.  Denies AH or VH. Denies self harm.  Denies substance use. Denies alcohol use.  Previous medication trials:  Carbamazepine Depakote Artane Lexapro - SI Latuda Zoloft Serzone Lamictal  Xanax    GAD-7    Flowsheet Row Office Visit from 08/20/2023 in [facility] [employer] Visit from 07/09/2023 in [facility] [employer] [facility]  Total GAD-7 Score 7 0   PHQ2-9    Flowsheet Row Office Visit from 08/20/2023 in [facility] Visit from 07/09/2023 in [facility] [employer] Visit from 10/29/2022 in [facility] [employer] Visit from 09/02/2022 in [facility]  Office Visit from 09/23/2018 in [facility] Total Score 2 0 0 3 0  PHQ-9 Total Score 9 2 -- 10 0     Review of Systems:  Review of Systems  Musculoskeletal:  Negative for gait problem.  Neurological:  Negative for tremors.  Psychiatric/Behavioral:         Please refer to HPI    Medications: I have reviewed the patient's current medications.  Current Outpatient Medications  Medication Sig Dispense Refill   Azelastine  HCl 137 MCG/SPRAY SOLN PLACE 1 SPRAY INTO BOTH NOSTRILS 2 (TWO) TIMES DAILY. USE IN EACH NOSTRIL AS DIRECTED (Patient not taking: Reported on 08/20/2023) 30 mL 1   buPROPion  (WELLBUTRIN  XL) 300 MG 24 hr tablet Take 1 tablet (300 mg total) by mouth daily. 90 tablet 1   Cholecalciferol  (VITAMIN D3) 20 MCG (800 UNIT) TABS Take 1 capsule by mouth daily.     ferrous sulfate 325 (65 FE) MG tablet Take 325 mg by mouth daily with breakfast.     hydrochlorothiazide  (HYDRODIURIL ) 25 MG tablet TAKE 1 TABLET (25 MG TOTAL) BY MOUTH DAILY. 90 tablet 3   Ivermectin 1 % CREA Apply 1 application topically daily as needed.     lamoTRIgine  (LAMICTAL ) 150 MG tablet TAKE 2 TABLETS BY MOUTH DAILY. 180 tablet 1   losartan  (COZAAR ) 100 MG tablet Take 1 tablet (100 mg total) by mouth daily. 90 tablet 3   pimecrolimus (ELIDEL) 1 % cream Apply topically 2 (two) times daily.     potassium chloride  (KLOR-CON  M10) 10 MEQ tablet TAKE 1 TABLET (10 MEQ TOTAL) BY MOUTH EVERY OTHER DAY. 45 tablet 3   triamcinolone cream (KENALOG) 0.1 % SMARTSIG:1 Application Topical 2-3  Times Daily     No current facility-administered medications for this visit.    Medication Side Effects: None  Allergies:  Allergies  Allergen Reactions   Lexapro  [Escitalopram Oxalate] Other (See Comments)    Pt reports she had sleeplessness, delusional thinking and self cutting   Lisinopril     cough    Past Medical History:  Diagnosis Date   Chicken pox    Depression     Hypertension    Migraines    Psoriasis     Past Medical History, Surgical history, Social history, and Family history were reviewed and updated as appropriate.   Please see review of systems for further details on the patient's review from today.   Objective:   Physical Exam:  There were no vitals taken for this visit.  Physical Exam Constitutional:      General: She is not in acute distress.  Musculoskeletal:        General: No deformity.   Neurological:     Mental Status: She is alert and oriented to person, place, and time.     Coordination: Coordination normal.   Psychiatric:        Attention and Perception: Attention and perception normal. She does not perceive auditory or visual hallucinations.        Mood and Affect: Mood normal. Mood is not anxious or depressed. Affect is not labile, blunt, angry or inappropriate.        Speech: Speech normal.        Behavior: Behavior normal.        Thought Content: Thought content normal. Thought content is not paranoid or delusional. Thought content does not include homicidal or suicidal ideation. Thought content does not include homicidal or suicidal plan.        Cognition and Memory: Cognition and memory normal.        Judgment: Judgment normal.     Comments: Insight intact    Lab Review:     Component Value Date/Time   NA 140 07/28/2023 0848   K 3.9 07/28/2023 0848   CL 101 07/28/2023 0848   CO2 30 07/28/2023 0848   GLUCOSE 100 (H) 07/28/2023 0848   BUN 16 07/28/2023 0848   CREATININE 0.89 07/28/2023 0848   CALCIUM 9.4 07/28/2023 0848   PROT 7.0 07/28/2023 0848   ALBUMIN 4.3 07/28/2023 0848   AST 16 07/28/2023 0848   ALT 21 07/28/2023 0848   ALKPHOS 89 07/28/2023 0848   BILITOT 0.4 07/28/2023 0848       Component Value Date/Time   WBC 7.9 10/02/2023 0742   RBC 4.80 10/02/2023 0742   HGB 14.1 10/02/2023 0742   HCT 43.8 10/02/2023 0742   PLT 396.0 10/02/2023 0742   MCV 91.1 10/02/2023 0742   MCH 27.4  12/06/2019 1129   MCHC 32.3 10/02/2023 0742   RDW 13.6 10/02/2023 0742   LYMPHSABS 1.4 10/02/2023 0742   MONOABS 0.8 10/02/2023 0742   EOSABS 0.2 10/02/2023 0742   BASOSABS 0.1 10/02/2023 0742    No results found for: POCLITH, LITHIUM   No results found for: PHENYTOIN, PHENOBARB, VALPROATE, CBMZ   .res Assessment: Plan:    Plan:  PDMP reviewed  Lamictal  300mg  daily Wellbutrin  XL 300omg every morning  RTC 6 months  Patient advised to contact office with any questions, adverse effects, or acute worsening in signs and symptoms.   Counseled patient regarding potential benefits, risks, and side effects of Lamictal  to include potential risk of Stevens-Johnson syndrome. Advised  patient to stop taking Lamictal  and contact office immediately if rash develops and to seek urgent medical attention if rash is severe and/or spreading quickly.    There are no diagnoses linked to this encounter.   Please see After Visit Summary for patient specific instructions.  No future appointments.  No orders of the defined types were placed in this encounter.   -------------------------------

## 2024-06-22 ENCOUNTER — Other Ambulatory Visit: Payer: Self-pay | Admitting: Obstetrics and Gynecology

## 2024-06-22 DIAGNOSIS — R928 Other abnormal and inconclusive findings on diagnostic imaging of breast: Secondary | ICD-10-CM

## 2024-06-24 ENCOUNTER — Ambulatory Visit: Admission: RE | Admit: 2024-06-24 | Payer: Self-pay | Source: Ambulatory Visit

## 2024-06-24 ENCOUNTER — Ambulatory Visit
Admission: RE | Admit: 2024-06-24 | Discharge: 2024-06-24 | Disposition: A | Discharge status: Home / Self Care | Source: Ambulatory Visit | Attending: Obstetrics and Gynecology | Admitting: Obstetrics and Gynecology

## 2024-06-24 DIAGNOSIS — R928 Other abnormal and inconclusive findings on diagnostic imaging of breast: Secondary | ICD-10-CM

## 2024-07-02 ENCOUNTER — Other Ambulatory Visit: Payer: Self-pay

## 2024-08-18 ENCOUNTER — Other Ambulatory Visit: Payer: Self-pay | Admitting: Family

## 2024-08-18 DIAGNOSIS — I10 Essential (primary) hypertension: Secondary | ICD-10-CM

## 2024-08-26 ENCOUNTER — Encounter: Payer: Self-pay | Admitting: Family

## 2024-08-26 ENCOUNTER — Ambulatory Visit: Admitting: Family

## 2024-08-26 ENCOUNTER — Encounter: Payer: Self-pay | Admitting: Adult Health

## 2024-08-26 ENCOUNTER — Telehealth (INDEPENDENT_AMBULATORY_CARE_PROVIDER_SITE_OTHER): Admitting: Adult Health

## 2024-08-26 VITALS — BP 122/78 | HR 79 | Temp 98.2°F | Ht 61.0 in | Wt 138.0 lb

## 2024-08-26 DIAGNOSIS — F331 Major depressive disorder, recurrent, moderate: Secondary | ICD-10-CM | POA: Diagnosis not present

## 2024-08-26 DIAGNOSIS — R200 Anesthesia of skin: Secondary | ICD-10-CM

## 2024-08-26 DIAGNOSIS — I10 Essential (primary) hypertension: Secondary | ICD-10-CM

## 2024-08-26 DIAGNOSIS — Z8639 Personal history of other endocrine, nutritional and metabolic disease: Secondary | ICD-10-CM

## 2024-08-26 DIAGNOSIS — F39 Unspecified mood [affective] disorder: Secondary | ICD-10-CM | POA: Diagnosis not present

## 2024-08-26 DIAGNOSIS — Z23 Encounter for immunization: Secondary | ICD-10-CM | POA: Diagnosis not present

## 2024-08-26 DIAGNOSIS — F411 Generalized anxiety disorder: Secondary | ICD-10-CM

## 2024-08-26 DIAGNOSIS — E782 Mixed hyperlipidemia: Secondary | ICD-10-CM | POA: Diagnosis not present

## 2024-08-26 DIAGNOSIS — R790 Abnormal level of blood mineral: Secondary | ICD-10-CM | POA: Diagnosis not present

## 2024-08-26 DIAGNOSIS — L659 Nonscarring hair loss, unspecified: Secondary | ICD-10-CM

## 2024-08-26 MED ORDER — LAMOTRIGINE 150 MG PO TABS: 300.0000 mg | ORAL_TABLET | Freq: Every day | ORAL | 1 refills | Status: AC

## 2024-08-26 MED ORDER — LAMOTRIGINE 25 MG PO TABS: ORAL_TABLET | ORAL | 2 refills | Status: DC

## 2024-08-26 MED ORDER — BUPROPION HCL ER (XL) 300 MG PO TB24: 300.0000 mg | ORAL_TABLET | Freq: Every day | ORAL | 1 refills | Status: AC

## 2024-08-26 NOTE — Assessment & Plan Note (Signed)
 Generalized. Pending labs. Advised follow up with dermatology and to trial otc rogaine topical for women.

## 2024-08-26 NOTE — Assessment & Plan Note (Signed)
 Resolved. Declines epipen, allergy referral. Discussed potential for robust, life threatening response in the future.

## 2024-08-26 NOTE — Progress Notes (Signed)
 Assessment & Plan:  Need for influenza vaccination -     Flu vaccine trivalent PF, 6mos and older(Flulaval,Afluria,Fluarix,Fluzone)  Low ferritin -     Iron, TIBC and Ferritin Panel  Essential hypertension Assessment & Plan: Chronic, stable. She is not orthostatic ( see flow sheet). Discussed episodic dizziness and concern anti hypertensives could be aggravating. Continue losartan  100 mg daily, hydrochlorothiazide  25 mg daily for now with a low threshold for decreasing hydrochlorothiazide . She will let me know of symptom recurrence so we can proceed with further work up.   Orders: -     CBC with Differential/Platelet -     TSH  Mixed hyperlipidemia -     Lipid panel  History of vitamin D  deficiency -     VITAMIN D  25 Hydroxy (Vit-D Deficiency, Fractures)  Lip numbness Assessment & Plan: Resolved. Declines epipen, allergy referral. Discussed potential for robust, life threatening response in the future.   Orders: -     B12 and Folate Panel  Hair thinning Assessment & Plan: Generalized. Pending labs. Advised follow up with dermatology and to trial otc rogaine topical for women.       Return precautions given.   Risks, benefits, and alternatives of the medications and treatment plan prescribed today were discussed, and patient expressed understanding.   Education regarding symptom management and diagnosis given to patient on AVS either electronically or printed.  No follow-ups on file.  Rollene Northern, FNP  Subjective:    Patient ID: Brooke Harmon, female    DOB: Apr 04, 1961, 63 y.o.   MRN: [id number]  CC: Brooke Harmon is a 63 y.o. female who presents today for follow up.   HPI: HPI Discussed the use of AI scribe software for clinical note transcription with the patient, who gave verbal consent to proceed.  History of Present Illness   Brooke Harmon is a 63 year old female who presents with concerns of hair thinning and  dizziness.  She has been experiencing generalized hair thinning since springtime, with increased visibility of her scalp, but no excessive hair shedding around her home. There is a family history of generally thin hair. She has not had any recent viral illnesses or significant weight changes.  She reports episodes of dizziness over the past few weeks, described as feeling 'not totally clear in my head' and 'lightheaded'. She is not dizzy today.  These episodes are intermittent; the patient did not notice any particular pattern or consistent circumstance related to her occurrence. Denies  chest pain or heart palpitations during these episodes. Denies sinus congestion, vertigo or syncope .  She recalls a possible allergic reaction six weeks ago after consuming a crab cake and farro, which resulted in a 'weird sensation' of numbness around her lips similar to a past allergic reaction, but without sob, hives or itching. Symptoms resolved with Benadryl.   She has no known history of anaphylaxis or shellfish allergy.       Allergies: Lexapro  [escitalopram oxalate], Escitalopram, and Lisinopril Current Outpatient Medications on File Prior to Visit  Medication Sig Dispense Refill   buPROPion  (WELLBUTRIN  XL) 300 MG 24 hr tablet Take 1 tablet (300 mg total) by mouth daily. 90 tablet 1   Cholecalciferol  (VITAMIN D3) 20 MCG (800 UNIT) TABS Take 1 capsule by mouth daily.     ferrous sulfate 325 (65 FE) MG tablet Take 325 mg by mouth daily with breakfast.     hydrochlorothiazide  (HYDRODIURIL ) 25 MG tablet TAKE 1 TABLET (25 MG  TOTAL) BY MOUTH DAILY. 90 tablet 3   Ivermectin 1 % CREA Apply 1 application topically daily as needed.     lamoTRIgine  (LAMICTAL ) 150 MG tablet Take 2 tablets (300 mg total) by mouth daily. 180 tablet 1   lamoTRIgine  (LAMICTAL ) 25 MG tablet Take two tablets every morning. 60 tablet 2   losartan  (COZAAR ) 100 MG tablet TAKE 1 TABLET BY MOUTH EVERY DAY 90 tablet 3   pimecrolimus  (ELIDEL) 1 % cream Apply topically 2 (two) times daily.     potassium chloride  (KLOR-CON  M10) 10 MEQ tablet TAKE 1 TABLET (10 MEQ TOTAL) BY MOUTH EVERY OTHER DAY. 45 tablet 3   triamcinolone cream (KENALOG) 0.1 % SMARTSIG:1 Application Topical 2-3 Times Daily     No current facility-administered medications on file prior to visit.    Review of Systems  Constitutional:  Negative for chills and fever.  HENT:  Negative for congestion.   Eyes:  Negative for visual disturbance.  Respiratory:  Negative for cough.   Cardiovascular:  Negative for chest pain, palpitations and leg swelling.  Gastrointestinal:  Negative for nausea and vomiting.  Neurological:  Negative for dizziness (resolved today) and headaches.      Objective:    BP 122/78   Pulse 79   Temp 98.2 F (36.8 C) (Oral)   Ht 5' 1 (1.549 m)   Wt 138 lb (62.6 kg)   SpO2 95%   BMI 26.07 kg/m  BP Readings from Last 3 Encounters:  08/26/24 122/78  08/20/23 118/78  07/09/23 120/70   Wt Readings from Last 3 Encounters:  08/26/24 138 lb (62.6 kg)  08/20/23 146 lb 12.8 oz (66.6 kg)  07/09/23 146 lb 3.2 oz (66.3 kg)    Physical Exam Vitals reviewed.  Constitutional:      Appearance: She is well-developed.  HENT:     Head:     Comments: Generalized hair thinning Eyes:     Conjunctiva/sclera: Conjunctivae normal.  Neck:     Thyroid : No thyroid  mass, thyromegaly or thyroid  tenderness.     Vascular: No carotid bruit.  Cardiovascular:     Rate and Rhythm: Normal rate and regular rhythm.     Pulses: Normal pulses.     Heart sounds: Normal heart sounds.  Pulmonary:     Effort: Pulmonary effort is normal.     Breath sounds: Normal breath sounds. No wheezing, rhonchi or rales.  Musculoskeletal:     Right lower leg: No edema.     Left lower leg: No edema.  Skin:    General: Skin is warm and dry.  Neurological:     Mental Status: She is alert.  Psychiatric:        Speech: Speech normal.        Behavior: Behavior  normal.        Thought Content: Thought content normal.

## 2024-08-26 NOTE — Progress Notes (Signed)
 Brooke Harmon [id number] 06/07/1961 63 y.o.  Virtual Visit via Video Note  I connected with pt @ on 08/26/24 at  8:00 AM EDT by a video enabled telemedicine application and verified that I am speaking with the correct person using two identifiers.   I discussed the limitations of evaluation and management by telemedicine and the availability of in person appointments. The patient expressed understanding and agreed to proceed.  I discussed the assessment and treatment plan with the patient. The patient was provided an opportunity to ask questions and all were answered. The patient agreed with the plan and demonstrated an understanding of the instructions.   The patient was advised to call back or seek an in-person evaluation if the symptoms worsen or if the condition fails to improve as anticipated.  I provided 25 minutes of non-face-to-face time during this encounter.  The patient was located at home.  The provider was located at [facility].   Angeline LOISE Sayers, NP   Subjective:   Patient ID:  Brooke Harmon is a 63 y.o. (DOB 06-27-1961) female.  Chief Complaint: No chief complaint on file.   HPI Zori Benbrook Choo presents for follow-up of Episodic Mood disorder, MDD and GAD.  Describes mood today as ok. Pleasant. Denies tearfulness. Mood symptoms - reports both generalized and situational anxiety. Denies depression and irritability. Reports stable interest and motivation. Denies panic attacks. Reports worry, rumination, and over thinking. Denies obsessive thoughts and acts. Reports mood is variable. Denies feeling manic. Stating I feel like I'm struggling. Feels like current medication regimen is helpful, but plans to see PCP today for evaluation and labs. She is also willing to increase the Lamictal . Taking medications as prescribed.  Energy levels stable. Active, does not have a regular exercise routine. Walking some days. Enjoys some usual  interests and activities. Married. Lives with husband and daughter. Son lives in [place], [place]. Spending time with family. Appetite adequate. Weight stable. Sleeps well most nights. Averages 6 hours. Reports some difficulties with focus and concentration. Completing tasks. Managing aspects of household. Works full-time as a Designer, multimedia. Denies SI or HI.  Denies AH or VH. Denies self harm.  Denies substance use. Denies alcohol use.  Previous medication trials:  Carbamazepine Depakote Artane Lexapro - SI Latuda Zoloft Serzone Lamictal  Xanax    Review of Systems:  Review of Systems  Musculoskeletal:  Negative for gait problem.  Neurological:  Negative for tremors.  Psychiatric/Behavioral:         Please refer to HPI    Medications: I have reviewed the patient's current medications.  Current Outpatient Medications  Medication Sig Dispense Refill   Azelastine  HCl 137 MCG/SPRAY SOLN PLACE 1 SPRAY INTO BOTH NOSTRILS 2 (TWO) TIMES DAILY. USE IN EACH NOSTRIL AS DIRECTED (Patient not taking: Reported on 08/20/2023) 30 mL 1   buPROPion  (WELLBUTRIN  XL) 300 MG 24 hr tablet Take 1 tablet (300 mg total) by mouth daily. 90 tablet 1   Cholecalciferol  (VITAMIN D3) 20 MCG (800 UNIT) TABS Take 1 capsule by mouth daily.     ferrous sulfate 325 (65 FE) MG tablet Take 325 mg by mouth daily with breakfast.     hydrochlorothiazide  (HYDRODIURIL ) 25 MG tablet TAKE 1 TABLET (25 MG TOTAL) BY MOUTH DAILY. 90 tablet 3   Ivermectin 1 % CREA Apply 1 application topically daily as needed.     lamoTRIgine  (LAMICTAL ) 150 MG tablet Take 2 tablets (300 mg total) by mouth daily. 180 tablet 1   losartan  (  COZAAR ) 100 MG tablet TAKE 1 TABLET BY MOUTH EVERY DAY 90 tablet 3   pimecrolimus (ELIDEL) 1 % cream Apply topically 2 (two) times daily.     potassium chloride  (KLOR-CON  M10) 10 MEQ tablet TAKE 1 TABLET (10 MEQ TOTAL) BY MOUTH EVERY OTHER DAY. 45 tablet 3   triamcinolone cream (KENALOG) 0.1 % SMARTSIG:1  Application Topical 2-3 Times Daily     No current facility-administered medications for this visit.    Medication Side Effects: None  Allergies:  Allergies  Allergen Reactions   Lexapro  [Escitalopram Oxalate] Other (See Comments)    Pt reports she had sleeplessness, delusional thinking and self cutting   Lisinopril     cough    Past Medical History:  Diagnosis Date   Chicken pox    Depression    Hypertension    Migraines    Psoriasis     Family History  Problem Relation Age of Onset   Dementia Mother    Anxiety disorder Mother    Depression Mother    Hypertension Mother    Mental illness Mother    Cancer Father        prostate   Hypertension Father    Mental illness Father    Diabetes Father    Depression Maternal Grandmother    Anxiety disorder Maternal Grandmother     Social History   Socioeconomic History   Marital status: Married    Spouse name: Prentice   Number of children: 2   Years of education: 18   Highest education level: Manufacturing engineer (e.g., MA, MS, MEng, MEd, MSW, MBA)  Occupational History   Occupation: Librarian [employer]  Tobacco Use   Smoking status: Never   Smokeless tobacco: Never  Vaping Use   Vaping status: Never Used  Substance and Sexual Activity   Alcohol use: Yes    Comment: light 4 oz  several nights per week   Drug use: No   Sexual activity: Not Currently  Other Topics Concern   Not on file  Social History Narrative   Lives at home with husband and daughter in [place]. Likes to read, walk, cooking and baking in free time.       Librarian at [employer]       She has a son and daughter.  Daughter has struggled with her mental health.       H/o suicide attempt 2014       Her husband has a gun in home and stored in gun safe; she doesn't have access to guns or key.       Confident in both her brother Alm and daughter Gwenlyn.    Social Drivers of Corporate investment banker Strain: Low Risk  (08/25/2024)   Overall  Financial Resource Strain (CARDIA)    Difficulty of Paying Living Expenses: Not hard at all  Food Insecurity: No Food Insecurity (08/25/2024)   Hunger Vital Sign    Worried About Running Out of Food in the Last Year: Never true    Ran Out of Food in the Last Year: Never true  Transportation Needs: No Transportation Needs (08/25/2024)   PRAPARE - Administrator, Civil Service (Medical): No    Lack of Transportation (Non-Medical): No  Physical Activity: Insufficiently Active (08/25/2024)   Exercise Vital Sign    Days of Exercise per Week: 3 days    Minutes of Exercise per Session: 30 min  Stress: Stress Concern Present (08/25/2024)   [employer]  of Occupational Health - Occupational Stress Questionnaire    Feeling of Stress: Rather much  Social Connections: Moderately Integrated (08/25/2024)   Social Connection and Isolation Panel    Frequency of Communication with Friends and Family: Three times a week    Frequency of Social Gatherings with Friends and Family: Three times a week    Attends Religious Services: 1 to 4 times per year    Active Member of Clubs or Organizations: No    Attends Engineer, structural: Not on file    Marital Status: Married  Catering manager Violence: Not on file    Past Medical History, Surgical history, Social history, and Family history were reviewed and updated as appropriate.   Please see review of systems for further details on the patient's review from today.   Objective:   Physical Exam:  There were no vitals taken for this visit.  Physical Exam  Lab Review:     Component Value Date/Time   NA 140 07/28/2023 0848   K 3.9 07/28/2023 0848   CL 101 07/28/2023 0848   CO2 30 07/28/2023 0848   GLUCOSE 100 (H) 07/28/2023 0848   BUN 16 07/28/2023 0848   CREATININE 0.89 07/28/2023 0848   CALCIUM 9.4 07/28/2023 0848   PROT 7.0 07/28/2023 0848   ALBUMIN 4.3 07/28/2023 0848   AST 16 07/28/2023 0848   ALT 21 07/28/2023 0848    ALKPHOS 89 07/28/2023 0848   BILITOT 0.4 07/28/2023 0848       Component Value Date/Time   WBC 7.9 10/02/2023 0742   RBC 4.80 10/02/2023 0742   HGB 14.1 10/02/2023 0742   HCT 43.8 10/02/2023 0742   PLT 396.0 10/02/2023 0742   MCV 91.1 10/02/2023 0742   MCH 27.4 12/06/2019 1129   MCHC 32.3 10/02/2023 0742   RDW 13.6 10/02/2023 0742   LYMPHSABS 1.4 10/02/2023 0742   MONOABS 0.8 10/02/2023 0742   EOSABS 0.2 10/02/2023 0742   BASOSABS 0.1 10/02/2023 0742    No results found for: POCLITH, LITHIUM   No results found for: PHENYTOIN, PHENOBARB, VALPROATE, CBMZ   .res Assessment: Plan:    Plan:  PDMP reviewed  Lamictal  300mg  daily Wellbutrin  XL 300 mg every morning  RTC 4 weeks months  25 minutes spent dedicated to the care of this patient on the date of this encounter to include pre-visit review of records, ordering of medication, post visit documentation, and face-to-face time with the patient discussing Episodic Mood disorder, MDD and GAD. Discussed continuing current medication regimen.  Patient advised to contact office with any questions, adverse effects, or acute worsening in signs and symptoms.   Counseled patient regarding potential benefits, risks, and side effects of Lamictal  to include potential risk of Stevens-Johnson syndrome. Advised patient to stop taking Lamictal  and contact office immediately if rash develops and to seek urgent medical attention if rash is severe and/or spreading quickly.    There are no diagnoses linked to this encounter.   Please see After Visit Summary for patient specific instructions.  Future Appointments  Date Time Provider Department Center  08/26/2024  8:00 AM Antoinetta Berrones Nattalie, NP [facility] None  08/26/2024  3:30 PM Dineen Rollene MATSU, FNP [id number]  11/18/2024  8:30 AM Robyne Matar Nattalie, NP [facility] None    No orders of the defined types were placed in this encounter.      -------------------------------

## 2024-08-26 NOTE — Assessment & Plan Note (Addendum)
 Chronic, stable. She is not orthostatic ( see flow sheet). Discussed episodic dizziness and concern anti hypertensives could be aggravating. Continue losartan  100 mg daily, hydrochlorothiazide  25 mg daily for now with a low threshold for decreasing hydrochlorothiazide . She will let me know of symptom recurrence so we can proceed with further work up.

## 2024-08-26 NOTE — Patient Instructions (Signed)
 For hair loss , please use OTC Rogaine (minoxidil) 5% solution/foam as directed.    Oral treatments in female patients who have no contraindication may include as managed by dermatology may include below. Please reach out to [facility].   - Low dose oral minoxidil 1.25 - 5mg  daily - Spironolactone 50 - 100mg  bid - Finasteride 2.5 - 5 mg daily  Adjunctive therapies include: - Low Level Laser Light Therapy (LLLT) - Platelet-rich plasma injections (PRP) - Hair Transplants or scalp reduction

## 2024-08-27 LAB — CBC WITH DIFFERENTIAL/PLATELET
Basophils Absolute: 0.1 K/uL (ref 0.0–0.1)
Basophils Relative: 0.9 % (ref 0.0–3.0)
Eosinophils Absolute: 0.1 K/uL (ref 0.0–0.7)
Eosinophils Relative: 1.9 % (ref 0.0–5.0)
HCT: 38.5 % (ref 36.0–46.0)
Hemoglobin: 12.8 g/dL (ref 12.0–15.0)
Lymphocytes Relative: 24.1 % (ref 12.0–46.0)
Lymphs Abs: 1.6 K/uL (ref 0.7–4.0)
MCHC: 33.2 g/dL (ref 30.0–36.0)
MCV: 90.1 fl (ref 78.0–100.0)
Monocytes Absolute: 0.9 K/uL (ref 0.1–1.0)
Monocytes Relative: 14.1 % — ABNORMAL HIGH (ref 3.0–12.0)
Neutro Abs: 3.8 K/uL (ref 1.4–7.7)
Neutrophils Relative %: 59 % (ref 43.0–77.0)
Platelets: 407 K/uL — ABNORMAL HIGH (ref 150.0–400.0)
RBC: 4.27 Mil/uL (ref 3.87–5.11)
RDW: 14.1 % (ref 11.5–15.5)
WBC: 6.5 K/uL (ref 4.0–10.5)

## 2024-08-27 LAB — IRON,TIBC AND FERRITIN PANEL
%SAT: 8 % — ABNORMAL LOW (ref 16–45)
Ferritin: 12 ng/mL — ABNORMAL LOW (ref 16–288)
Iron: 30 ug/dL — ABNORMAL LOW (ref 45–160)
TIBC: 379 ug/dL (ref 250–450)

## 2024-08-27 LAB — TSH: TSH: 2.2 u[IU]/mL (ref 0.35–5.50)

## 2024-08-27 LAB — VITAMIN D 25 HYDROXY (VIT D DEFICIENCY, FRACTURES): VITD: 31.04 ng/mL (ref 30.00–100.00)

## 2024-08-27 LAB — LIPID PANEL
Cholesterol: 203 mg/dL — ABNORMAL HIGH (ref 0–200)
HDL: 64.9 mg/dL (ref >39.00)
LDL Cholesterol: 94 mg/dL (ref 0–99)
NonHDL: 138.09
Total CHOL/HDL Ratio: 3
Triglycerides: 219 mg/dL — ABNORMAL HIGH (ref 0.0–149.0)
VLDL: 43.8 mg/dL — ABNORMAL HIGH (ref 0.0–40.0)

## 2024-08-27 LAB — B12 AND FOLATE PANEL
Folate: >22.9 ng/mL (ref >5.9)
Vitamin B-12: 575 pg/mL (ref 211–911)

## 2024-09-02 ENCOUNTER — Ambulatory Visit: Payer: Self-pay | Admitting: Family

## 2024-09-02 DIAGNOSIS — I10 Essential (primary) hypertension: Secondary | ICD-10-CM

## 2024-09-09 ENCOUNTER — Ambulatory Visit: Admitting: Family

## 2024-09-20 ENCOUNTER — Other Ambulatory Visit: Payer: Self-pay | Admitting: Family

## 2024-09-20 DIAGNOSIS — R899 Unspecified abnormal finding in specimens from other organs, systems and tissues: Secondary | ICD-10-CM

## 2024-09-23 ENCOUNTER — Telehealth (INDEPENDENT_AMBULATORY_CARE_PROVIDER_SITE_OTHER): Admitting: Adult Health

## 2024-09-23 ENCOUNTER — Encounter: Payer: Self-pay | Admitting: Adult Health

## 2024-09-23 ENCOUNTER — Other Ambulatory Visit: Payer: Self-pay | Admitting: Family

## 2024-09-23 DIAGNOSIS — I10 Essential (primary) hypertension: Secondary | ICD-10-CM

## 2024-09-23 DIAGNOSIS — F411 Generalized anxiety disorder: Secondary | ICD-10-CM

## 2024-09-23 DIAGNOSIS — F39 Unspecified mood [affective] disorder: Secondary | ICD-10-CM

## 2024-09-23 MED ORDER — LAMOTRIGINE 25 MG PO TABS: ORAL_TABLET | ORAL | 1 refills | Status: AC

## 2024-09-23 NOTE — Progress Notes (Signed)
 Brooke Harmon [id number] 20-Jun-1961 63 y.o.  Virtual Visit via Video Note  I connected with pt @ on 09/23/24 at  8:30 AM EDT by a video enabled telemedicine application and verified that I am speaking with the correct person using two identifiers.   I discussed the limitations of evaluation and management by telemedicine and the availability of in person appointments. The patient expressed understanding and agreed to proceed.  I discussed the assessment and treatment plan with the patient. The patient was provided an opportunity to ask questions and all were answered. The patient agreed with the plan and demonstrated an understanding of the instructions.   The patient was advised to call back or seek an in-person evaluation if the symptoms worsen or if the condition fails to improve as anticipated.  I provided 15 minutes of non-face-to-face time during this encounter.  The patient was located at home.  The provider was located at [facility].   Brooke LOISE Sayers, NP   Subjective:   Patient ID:  Brooke Harmon is a 63 y.o. (DOB 1961/05/23) female.  Chief Complaint: No chief complaint on file.   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  Describes mood today as ok. Pleasant. Denies tearfulness. Mood symptoms - reports both generalized and situational anxiety. Denies depression and irritability. Reports stable interest and motivation. Denies panic attacks. Reports worry, rumination, and over thinking. Denies obsessive thoughts and acts. Reports mood is more stable. Denies feeling manic - I feel a bit more even - anxiety is better. Stating I still feel like I'm struggling - withdrawn from people at work, but not her family. Feels like current medication regimen is helpful. Taking medications as prescribed.  Energy levels stable. Active, does not have a regular exercise routine. Walking some days. Enjoys some usual  interests and activities. Married. Lives with husband and daughter. [place] Spending time with family. Appetite adequate. Weight stable. Sleeps well most nights. Averages 6 hours. Reports less difficulties with focus and concentration. Completing tasks. Managing aspects of household. Works full-time as a Designer, multimedia. Denies SI or HI.  Denies AH or VH. Denies self harm.  Denies substance use. Denies alcohol use.  Previous medication trials:  Carbamazepine Depakote Artane Lexapro - SI Latuda Zoloft Serzone Lamictal  Xanax   Review of Systems:  Review of Systems  Musculoskeletal:  Negative for gait problem.  Neurological:  Negative for tremors.  Psychiatric/Behavioral:         Please refer to HPI    Medications: I have reviewed the patient's current medications.  Current Outpatient Medications  Medication Sig Dispense Refill   buPROPion  (WELLBUTRIN  XL) 300 MG 24 hr tablet Take 1 tablet (300 mg total) by mouth daily. 90 tablet 1   Cholecalciferol  (VITAMIN D3) 20 MCG (800 UNIT) TABS Take 1 capsule by mouth daily.     ferrous sulfate 325 (65 FE) MG tablet Take 325 mg by mouth daily with breakfast.     hydrochlorothiazide  (HYDRODIURIL ) 25 MG tablet TAKE 1 TABLET (25 MG TOTAL) BY MOUTH DAILY. 90 tablet 3   Ivermectin 1 % CREA Apply 1 application topically daily as needed.     lamoTRIgine  (LAMICTAL ) 150 MG tablet Take 2 tablets (300 mg total) by mouth daily. 180 tablet 1   lamoTRIgine  (LAMICTAL ) 25 MG tablet Take two tablets every morning. 60 tablet 2   losartan  (COZAAR ) 100 MG tablet TAKE 1 TABLET BY MOUTH EVERY DAY 90 tablet 3  pimecrolimus (ELIDEL) 1 % cream Apply topically 2 (two) times daily.     potassium chloride  (KLOR-CON  M10) 10 MEQ tablet TAKE 1 TABLET (10 MEQ TOTAL) BY MOUTH EVERY OTHER DAY. 45 tablet 3   triamcinolone cream (KENALOG) 0.1 % SMARTSIG:1 Application Topical 2-3 Times Daily     No current facility-administered medications for this  visit.    Medication Side Effects: None  Allergies:  Allergies  Allergen Reactions   Lexapro  [Escitalopram Oxalate] Other (See Comments)    Pt reports she had sleeplessness, delusional thinking and self cutting   Escitalopram Other (See Comments)    Lexapro   Lisinopril     cough    Past Medical History:  Diagnosis Date   Anxiety    Chicken pox    Depression    Hypertension    Migraines    Psoriasis     Family History  Problem Relation Age of Onset   Dementia Mother    Anxiety disorder Mother    Depression Mother    Hypertension Mother    Mental illness Mother    Cancer Father        prostate   Hypertension Father    Mental illness Father    Diabetes Father    Arthritis Father    Depression Maternal Grandmother    Anxiety disorder Maternal Grandmother    ADD / ADHD Daughter     Social History   Socioeconomic History   Marital status: Married    Spouse name: Brooke Harmon   Number of children: 2   Years of education: 18   Highest education level: Manufacturing engineer (e.g., MA, MS, MEng, MEd, MSW, MBA)  Occupational History   Occupation: Librarian [employer]  Tobacco Use   Smoking status: Never   Smokeless tobacco: Never  Vaping Use   Vaping status: Never Used  Substance and Sexual Activity   Alcohol use: Yes    Comment: light 4 oz  several nights per week   Drug use: No   Sexual activity: Not Currently  Other Topics Concern   Not on file  Social History Narrative   Lives at home with husband and daughter in [place]. Likes to read, walk, cooking and baking in free time.       Librarian at [employer]       She has a son and daughter.  Daughter has struggled with her mental health.       H/o suicide attempt 2014       Her husband has a gun in home and stored in gun safe; she doesn't have access to guns or key.       Confident in both her brother Brooke Harmon and daughter [place].    Social Drivers of Corporate investment banker Strain: Low Risk  (08/25/2024)    Overall Financial Resource Strain (CARDIA)    Difficulty of Paying Living Expenses: Not hard at all  Food Insecurity: No Food Insecurity (08/25/2024)   Hunger Vital Sign    Worried About Running Out of Food in the Last Year: Never true    Ran Out of Food in the Last Year: Never true  Transportation Needs: No Transportation Needs (08/25/2024)   PRAPARE - Administrator, Civil Service (Medical): No    Lack of Transportation (Non-Medical): No  Physical Activity: Insufficiently Active (08/25/2024)   Exercise Vital Sign    Days of Exercise per Week: 3 days    Minutes of Exercise per Session: 30 min  Stress: Stress Concern Present (08/25/2024)   [employer] of Occupational Health - Occupational Stress Questionnaire    Feeling of Stress: Rather much  Social Connections: Moderately Integrated (08/25/2024)   Social Connection and Isolation Panel    Frequency of Communication with Friends and Family: Three times a week    Frequency of Social Gatherings with Friends and Family: Three times a week    Attends Religious Services: 1 to 4 times per year    Active Member of Clubs or Organizations: No    Attends Engineer, structural: Not on file    Marital Status: Married  Catering manager Violence: Not on file    Past Medical History, Surgical history, Social history, and Family history were reviewed and updated as appropriate.   Please see review of systems for further details on the patient's review from today.   Objective:   Physical Exam:  There were no vitals taken for this visit.  Physical Exam Constitutional:      General: She is not in acute distress. Musculoskeletal:        General: No deformity.  Neurological:     Mental Status: She is alert and oriented to person, place, and time.     Coordination: Coordination normal.  Psychiatric:        Attention and Perception: Attention and perception normal. She does not perceive auditory or visual hallucinations.         Mood and Affect: Mood normal. Mood is not anxious or depressed. Affect is not labile, blunt, angry or inappropriate.        Speech: Speech normal.        Behavior: Behavior normal.        Thought Content: Thought content normal. Thought content is not paranoid or delusional. Thought content does not include homicidal or suicidal ideation. Thought content does not include homicidal or suicidal plan.        Cognition and Memory: Cognition and memory normal.        Judgment: Judgment normal.     Comments: Insight intact     Lab Review:     Component Value Date/Time   NA 140 07/28/2023 0848   K 3.9 07/28/2023 0848   CL 101 07/28/2023 0848   CO2 30 07/28/2023 0848   GLUCOSE 100 (H) 07/28/2023 0848   BUN 16 07/28/2023 0848   CREATININE 0.89 07/28/2023 0848   CALCIUM 9.4 07/28/2023 0848   PROT 7.0 07/28/2023 0848   ALBUMIN 4.3 07/28/2023 0848   AST 16 07/28/2023 0848   ALT 21 07/28/2023 0848   ALKPHOS 89 07/28/2023 0848   BILITOT 0.4 07/28/2023 0848       Component Value Date/Time   WBC 6.5 08/26/2024 1615   RBC 4.27 08/26/2024 1615   HGB 12.8 08/26/2024 1615   HCT 38.5 08/26/2024 1615   PLT 407.0 (H) 08/26/2024 1615   MCV 90.1 08/26/2024 1615   MCH 27.4 12/06/2019 1129   MCHC 33.2 08/26/2024 1615   RDW 14.1 08/26/2024 1615   LYMPHSABS 1.6 08/26/2024 1615   MONOABS 0.9 08/26/2024 1615   EOSABS 0.1 08/26/2024 1615   BASOSABS 0.1 08/26/2024 1615    No results found for: POCLITH, LITHIUM   No results found for: PHENYTOIN, PHENOBARB, VALPROATE, CBMZ   .res Assessment: Plan:    Plan:  PDMP reviewed  Continue: Lamictal  50mg  daily Lamictal  300mg  daily Wellbutrin  XL 300 mg every morning  15 minutes spent dedicated to the care of this patient on the date of  this encounter to include pre-visit review of records, ordering of medication, post visit documentation, and face-to-face time with the patient discussing Episodic Mood disorder and GAD. Discussed  continuing current medication regimen.  Patient advised to contact office with any questions, adverse effects, or acute worsening in signs and symptoms.   Counseled patient regarding potential benefits, risks, and side effects of Lamictal  to include potential risk of Stevens-Johnson syndrome. Advised patient to stop taking Lamictal  and contact office immediately if rash develops and to seek urgent medical attention if rash is severe and/or spreading quickly.    There are no diagnoses linked to this encounter.   Please see After Visit Summary for patient specific instructions.  Future Appointments  Date Time Provider Department Center  09/23/2024  8:30 AM Cloys Vera Nattalie, NP [facility] None  10/20/2024  8:30 AM [facility] [address]  11/18/2024  8:30 AM Jsean Taussig [place], NP [facility] None    No orders of the defined types were placed in this encounter.     -------------------------------

## 2024-09-24 ENCOUNTER — Encounter: Payer: Self-pay | Admitting: Family

## 2024-10-11 NOTE — Addendum Note (Signed)
 Addended by: MARYLEN PRO A on: 10/11/2024 12:24 PM   Modules accepted: Orders

## 2024-10-20 ENCOUNTER — Other Ambulatory Visit: Payer: Self-pay

## 2024-10-20 ENCOUNTER — Other Ambulatory Visit (INDEPENDENT_AMBULATORY_CARE_PROVIDER_SITE_OTHER)

## 2024-10-20 DIAGNOSIS — R899 Unspecified abnormal finding in specimens from other organs, systems and tissues: Secondary | ICD-10-CM

## 2024-10-20 DIAGNOSIS — I10 Essential (primary) hypertension: Secondary | ICD-10-CM

## 2024-10-20 DIAGNOSIS — E782 Mixed hyperlipidemia: Secondary | ICD-10-CM

## 2024-10-20 NOTE — Addendum Note (Signed)
 Addended by: TANDA HARVEY D on: 10/20/2024 08:30 AM   Modules accepted: Orders

## 2024-10-21 LAB — COMPREHENSIVE METABOLIC PANEL WITH GFR
AG Ratio: 1.9 (calc) (ref 1.0–2.5)
ALT: 15 U/L (ref 6–29)
AST: 15 U/L (ref 10–35)
Albumin: 4.4 g/dL (ref 3.6–5.1)
Alkaline phosphatase (APISO): 73 U/L (ref 37–153)
BUN: 15 mg/dL (ref 7–25)
CO2: 26 mmol/L (ref 20–32)
Calcium: 9.1 mg/dL (ref 8.6–10.4)
Chloride: 104 mmol/L (ref 98–110)
Creat: 0.96 mg/dL (ref 0.50–1.05)
Globulin: 2.3 g/dL (ref 1.9–3.7)
Glucose, Bld: 104 mg/dL — ABNORMAL HIGH (ref 65–99)
Potassium: 3.7 mmol/L (ref 3.5–5.3)
Sodium: 141 mmol/L (ref 135–146)
Total Bilirubin: 0.4 mg/dL (ref 0.2–1.2)
Total Protein: 6.7 g/dL (ref 6.1–8.1)
eGFR: 66 mL/min/1.73m2 (ref >=60)

## 2024-10-21 LAB — LIPID PANEL
Cholesterol: 196 mg/dL (ref <200)
HDL: 71 mg/dL (ref >=50)
LDL Cholesterol (Calc): 106 mg/dL — ABNORMAL HIGH
Non-HDL Cholesterol (Calc): 125 mg/dL (ref <130)
Total CHOL/HDL Ratio: 2.8 (calc) (ref <5.0)
Triglycerides: 99 mg/dL (ref <150)

## 2024-10-21 LAB — IRON,TIBC AND FERRITIN PANEL
%SAT: 10 % — ABNORMAL LOW (ref 16–45)
Ferritin: 18 ng/mL (ref 16–288)
Iron: 37 ug/dL — ABNORMAL LOW (ref 45–160)
TIBC: 362 ug/dL (ref 250–450)

## 2024-10-21 LAB — CBC WITH DIFFERENTIAL/PLATELET
Absolute Lymphocytes: 1421 {cells}/uL (ref 850–3900)
Absolute Monocytes: 731 {cells}/uL (ref 200–950)
Basophils Absolute: 90 {cells}/uL (ref 0–200)
Basophils Relative: 1.3 %
Eosinophils Absolute: 97 {cells}/uL (ref 15–500)
Eosinophils Relative: 1.4 %
HCT: 38.7 % (ref 35.0–45.0)
Hemoglobin: 12.5 g/dL (ref 11.7–15.5)
MCH: 30.1 pg (ref 27.0–33.0)
MCHC: 32.3 g/dL (ref 32.0–36.0)
MCV: 93.3 fL (ref 80.0–100.0)
MPV: 9 fL (ref 7.5–12.5)
Monocytes Relative: 10.6 %
Neutro Abs: 4561 {cells}/uL (ref 1500–7800)
Neutrophils Relative %: 66.1 %
Platelets: 418 Thousand/uL — ABNORMAL HIGH (ref 140–400)
RBC: 4.15 Million/uL (ref 3.80–5.10)
RDW: 13.1 % (ref 11.0–15.0)
Total Lymphocyte: 20.6 %
WBC: 6.9 Thousand/uL (ref 3.8–10.8)

## 2024-10-25 ENCOUNTER — Other Ambulatory Visit: Payer: Self-pay

## 2024-10-25 ENCOUNTER — Ambulatory Visit: Payer: Self-pay | Admitting: Family

## 2024-10-25 DIAGNOSIS — R899 Unspecified abnormal finding in specimens from other organs, systems and tissues: Secondary | ICD-10-CM

## 2024-10-25 DIAGNOSIS — I10 Essential (primary) hypertension: Secondary | ICD-10-CM

## 2024-10-25 DIAGNOSIS — E782 Mixed hyperlipidemia: Secondary | ICD-10-CM

## 2024-11-13 ENCOUNTER — Other Ambulatory Visit: Payer: Self-pay | Admitting: Family

## 2024-11-13 DIAGNOSIS — I10 Essential (primary) hypertension: Secondary | ICD-10-CM

## 2024-11-18 ENCOUNTER — Encounter: Payer: Self-pay | Admitting: Adult Health

## 2024-11-18 ENCOUNTER — Ambulatory Visit: Admitting: Adult Health

## 2024-11-18 DIAGNOSIS — F39 Unspecified mood [affective] disorder: Secondary | ICD-10-CM

## 2024-11-18 DIAGNOSIS — F411 Generalized anxiety disorder: Secondary | ICD-10-CM | POA: Diagnosis not present

## 2024-11-18 NOTE — Progress Notes (Signed)
 Silvia Hightower Equihua [id number] 17-Mar-1961 63 y.o.  Subjective:   Patient ID:  Brooke Harmon is a 63 y.o. (DOB 03/27/1961) female.  Chief Complaint: No chief complaint on file.   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  Describes mood today as ok. Pleasant. Reports some tearfulness. Mood symptoms - reports both generalized and situational anxiety. Denies depression. Reports irritability. Reports stable interest and motivation. Denies panic attacks. Reports some worry, rumination, and over thinking. Denies obsessive thoughts and acts. Reports mood is more variable. Denies feeling manic. Stating I still feel like I'm doing ok. Feels like current medication regimen is helpful. Taking medications as prescribed.  Energy levels stable. Active, does not have a regular exercise routine. Walking some days. Enjoys some usual interests and activities. Married. Lives with husband and daughter. Son lives in [place], [place]. Spending time with family. Appetite adequate. Weight stable. Sleeps well most nights. Averages 6 hours. Reports some difficulties with focus and concentration. Completing tasks. Managing aspects of household. Works full-time as a Designer, Multimedia. Denies SI or HI.  Denies AH or VH. Denies self harm.  Denies substance use. Denies alcohol use.  Previous medication trials:  Carbamazepine Depakote Artane Lexapro - SI Latuda Zoloft Serzone Lamictal  Xanax   GAD-7    Flowsheet Row Office Visit from 08/26/2024 in [facility] Visit from 08/20/2023 in [facility] Visit from 07/09/2023 in [facility]  Total GAD-7 Score 7 7 0   PHQ2-9    Flowsheet Row Office Visit from 08/26/2024 in [facility] Visit from 08/20/2023 in [facility] Visit from 07/09/2023 in [facility] Visit from 10/29/2022 in [facility] Visit from 09/02/2022 in [facility]  PHQ-2 Total Score 1 2 0 0 3  PHQ-9 Total Score 3 9 2  -- 10     Review of Systems:  Review of Systems  Musculoskeletal:  Negative for gait problem.  Neurological:  Negative for tremors.  Psychiatric/Behavioral:         Please refer to HPI    Medications: I have reviewed the patient's current medications.  Current Outpatient Medications  Medication Sig Dispense Refill   buPROPion  (WELLBUTRIN  XL) 300 MG 24 hr tablet Take 1 tablet (300 mg total) by mouth daily. 90 tablet 1   Cholecalciferol  (VITAMIN D3) 20 MCG (800 UNIT) TABS Take 1 capsule by mouth daily.     ferrous sulfate 325 (65 FE) MG tablet Take 325 mg by mouth daily with breakfast.     hydrochlorothiazide  (HYDRODIURIL ) 25 MG tablet TAKE 1 TABLET (25 MG TOTAL) BY MOUTH DAILY. 90 tablet 3   Ivermectin 1 % CREA Apply 1 application topically daily as needed.     lamoTRIgine  (LAMICTAL ) 150 MG tablet Take 2 tablets (300 mg total) by mouth daily. 180 tablet 1   lamoTRIgine  (LAMICTAL ) 25 MG tablet Take two tablets every morning. 180 tablet 1   losartan  (COZAAR ) 100 MG tablet TAKE 1 TABLET BY MOUTH EVERY DAY 90 tablet 3   pimecrolimus (ELIDEL) 1 % cream Apply topically 2 (two) times daily.     potassium chloride  (KLOR-CON  M) 10 MEQ tablet TAKE 1 TABLET (10 MEQ TOTAL) BY MOUTH EVERY OTHER DAY. 45 tablet 3  triamcinolone cream (KENALOG) 0.1 % SMARTSIG:1 Application Topical 2-3 Times Daily     No current facility-administered medications for this visit.    Medication Side Effects: None  Allergies: Allergies[1]  Past Medical History:  Diagnosis Date   Anxiety    Chicken pox    Depression    Hypertension    Migraines    Psoriasis     Past Medical  History, Surgical history, Social history, and Family history were reviewed and updated as appropriate.   Please see review of systems for further details on the patient's review from today.   Objective:   Physical Exam:  There were no vitals taken for this visit.  Physical Exam Constitutional:      General: She is not in acute distress. Musculoskeletal:        General: No deformity.  Neurological:     Mental Status: She is alert and oriented to person, place, and time.     Coordination: Coordination normal.  Psychiatric:        Attention and Perception: Attention and perception normal. She does not perceive auditory or visual hallucinations.        Mood and Affect: Mood normal. Mood is not anxious or depressed. Affect is not labile, blunt, angry or inappropriate.        Speech: Speech normal.        Behavior: Behavior normal.        Thought Content: Thought content normal. Thought content is not paranoid or delusional. Thought content does not include homicidal or suicidal ideation. Thought content does not include homicidal or suicidal plan.        Cognition and Memory: Cognition and memory normal.        Judgment: Judgment normal.     Comments: Insight intact     Lab Review:     Component Value Date/Time   NA 141 10/20/2024 0830   K 3.7 10/20/2024 0830   CL 104 10/20/2024 0830   CO2 26 10/20/2024 0830   GLUCOSE 104 (H) 10/20/2024 0830   BUN 15 10/20/2024 0830   CREATININE 0.96 10/20/2024 0830   CALCIUM 9.1 10/20/2024 0830   PROT 6.7 10/20/2024 0830   ALBUMIN 4.3 07/28/2023 0848   AST 15 10/20/2024 0830   ALT 15 10/20/2024 0830   ALKPHOS 89 07/28/2023 0848   BILITOT 0.4 10/20/2024 0830       Component Value Date/Time   WBC 6.9 10/20/2024 0830   RBC 4.15 10/20/2024 0830   HGB 12.5 10/20/2024 0830   HCT 38.7 10/20/2024 0830   PLT 418 (H) 10/20/2024 0830   MCV 93.3 10/20/2024 0830   MCH 30.1 10/20/2024 0830   MCHC 32.3 10/20/2024 0830   RDW 13.1 10/20/2024  0830   LYMPHSABS 1.6 08/26/2024 1615   MONOABS 0.9 08/26/2024 1615   EOSABS 97 10/20/2024 0830   BASOSABS 90 10/20/2024 0830    No results found for: POCLITH, LITHIUM   No results found for: PHENYTOIN, PHENOBARB, VALPROATE, CBMZ   .res Assessment: Plan:    Plan:  PDMP reviewed  Continue: Lamictal  50mg  daily Lamictal  300mg  daily Wellbutrin  XL 300 mg every morning  15 minutes spent dedicated to the care of this patient on the date of this encounter to include pre-visit review of records, ordering of medication, post visit documentation, and face-to-face time with the patient discussing Episodic Mood disorder and GAD. Discussed continuing current medication regimen.  Patient advised to contact office with any questions, adverse effects, or acute worsening in signs  and symptoms.   Counseled patient regarding potential benefits, risks, and side effects of Lamictal  to include potential risk of Stevens-Johnson syndrome. Advised patient to stop taking Lamictal  and contact office immediately if rash develops and to seek urgent medical attention if rash is severe and/or spreading quickly.    There are no diagnoses linked to this encounter.   Please see After Visit Summary for patient specific instructions.  Future Appointments  Date Time Provider Department Center  11/18/2024  8:30 AM Farris Blash Nattalie, NP [facility] None  12/29/2024  8:30 AM [facility] [address]    No orders of the defined types were placed in this encounter.   -------------------------------    [1]  Allergies Allergen Reactions   Lexapro  [Escitalopram Oxalate] Other (See Comments)    Pt reports she had sleeplessness, delusional thinking and self cutting   Escitalopram Other (See Comments)    Lexapro   Lisinopril     cough

## 2024-12-29 ENCOUNTER — Other Ambulatory Visit

## 2024-12-29 DIAGNOSIS — R899 Unspecified abnormal finding in specimens from other organs, systems and tissues: Secondary | ICD-10-CM

## 2024-12-29 DIAGNOSIS — I10 Essential (primary) hypertension: Secondary | ICD-10-CM | POA: Diagnosis not present

## 2024-12-29 LAB — CBC WITH DIFFERENTIAL/PLATELET
Basophils Absolute: 0.1 10*3/uL (ref 0.0–0.1)
Basophils Relative: 1.1 % (ref 0.0–3.0)
Eosinophils Absolute: 0.2 10*3/uL (ref 0.0–0.7)
Eosinophils Relative: 2.4 % (ref 0.0–5.0)
HCT: 39 % (ref 36.0–46.0)
Hemoglobin: 13.1 g/dL (ref 12.0–15.0)
Lymphocytes Relative: 21.3 % (ref 12.0–46.0)
Lymphs Abs: 1.3 10*3/uL (ref 0.7–4.0)
MCHC: 33.6 g/dL (ref 30.0–36.0)
MCV: 92.4 fl (ref 78.0–100.0)
Monocytes Absolute: 0.7 10*3/uL (ref 0.1–1.0)
Monocytes Relative: 11.7 % (ref 3.0–12.0)
Neutro Abs: 3.9 10*3/uL (ref 1.4–7.7)
Neutrophils Relative %: 63.5 % (ref 43.0–77.0)
Platelets: 404 10*3/uL — ABNORMAL HIGH (ref 150.0–400.0)
RBC: 4.22 Mil/uL (ref 3.87–5.11)
RDW: 13.5 % (ref 11.5–15.5)
WBC: 6.2 10*3/uL (ref 4.0–10.5)

## 2024-12-29 LAB — IRON,TIBC AND FERRITIN PANEL
%SAT: 18 % (ref 16–45)
Ferritin: 20 ng/mL (ref 16–288)
Iron: 69 ug/dL (ref 45–160)
TIBC: 384 ug/dL (ref 250–450)

## 2025-03-17 ENCOUNTER — Ambulatory Visit: Admitting: Adult Health
